# Patient Record
Sex: Female | Born: 1970 | Race: Black or African American | Hispanic: No | Marital: Single | Smoking: Never smoker
Health system: Southern US, Community
[De-identification: ages and names within clinical notes are randomized; demographics above are authoritative.]

## PROBLEM LIST (undated history)

## (undated) DIAGNOSIS — I10 Essential (primary) hypertension: Secondary | ICD-10-CM

## (undated) DIAGNOSIS — D649 Anemia, unspecified: Secondary | ICD-10-CM

## (undated) HISTORY — PX: DILATION AND CURETTAGE OF UTERUS: SHX78

---

## 1998-02-07 ENCOUNTER — Encounter: Payer: Self-pay | Admitting: *Deleted

## 1998-02-07 ENCOUNTER — Inpatient Hospital Stay (HOSPITAL_COMMUNITY): Admission: AD | Admit: 1998-02-07 | Discharge: 1998-02-08 | Payer: Self-pay | Admitting: *Deleted

## 1998-09-20 ENCOUNTER — Inpatient Hospital Stay (HOSPITAL_COMMUNITY): Admission: AD | Admit: 1998-09-20 | Discharge: 1998-09-22 | Payer: Self-pay | Admitting: Obstetrics & Gynecology

## 1998-09-23 ENCOUNTER — Encounter (INDEPENDENT_AMBULATORY_CARE_PROVIDER_SITE_OTHER): Payer: Self-pay

## 1998-09-23 ENCOUNTER — Observation Stay (HOSPITAL_COMMUNITY): Admission: AD | Admit: 1998-09-23 | Discharge: 1998-09-24 | Payer: Self-pay | Admitting: Obstetrics

## 1998-09-24 ENCOUNTER — Encounter: Payer: Self-pay | Admitting: Obstetrics & Gynecology

## 1999-09-09 ENCOUNTER — Encounter (INDEPENDENT_AMBULATORY_CARE_PROVIDER_SITE_OTHER): Payer: Self-pay

## 1999-09-09 ENCOUNTER — Inpatient Hospital Stay (HOSPITAL_COMMUNITY): Admission: AD | Admit: 1999-09-09 | Discharge: 1999-09-10 | Payer: Self-pay | Admitting: *Deleted

## 1999-09-09 ENCOUNTER — Encounter: Payer: Self-pay | Admitting: *Deleted

## 2000-08-20 ENCOUNTER — Emergency Department (HOSPITAL_COMMUNITY): Admission: EM | Admit: 2000-08-20 | Discharge: 2000-08-20 | Payer: Self-pay | Admitting: *Deleted

## 2001-04-01 ENCOUNTER — Emergency Department (HOSPITAL_COMMUNITY): Admission: EM | Admit: 2001-04-01 | Discharge: 2001-04-01 | Payer: Self-pay | Admitting: Emergency Medicine

## 2002-05-25 ENCOUNTER — Inpatient Hospital Stay (HOSPITAL_COMMUNITY): Admission: AD | Admit: 2002-05-25 | Discharge: 2002-05-25 | Payer: Self-pay | Admitting: *Deleted

## 2002-05-27 ENCOUNTER — Emergency Department (HOSPITAL_COMMUNITY): Admission: EM | Admit: 2002-05-27 | Discharge: 2002-05-27 | Payer: Self-pay

## 2003-12-12 ENCOUNTER — Emergency Department (HOSPITAL_COMMUNITY): Admission: EM | Admit: 2003-12-12 | Discharge: 2003-12-12 | Payer: Self-pay | Admitting: Emergency Medicine

## 2004-11-16 ENCOUNTER — Inpatient Hospital Stay (HOSPITAL_COMMUNITY): Admission: AD | Admit: 2004-11-16 | Discharge: 2004-11-16 | Payer: Self-pay | Admitting: *Deleted

## 2005-09-07 ENCOUNTER — Inpatient Hospital Stay (HOSPITAL_COMMUNITY): Admission: AD | Admit: 2005-09-07 | Discharge: 2005-09-07 | Payer: Self-pay | Admitting: Obstetrics and Gynecology

## 2006-09-10 ENCOUNTER — Emergency Department (HOSPITAL_COMMUNITY): Admission: EM | Admit: 2006-09-10 | Discharge: 2006-09-10 | Payer: Self-pay | Admitting: Emergency Medicine

## 2007-04-30 ENCOUNTER — Ambulatory Visit (HOSPITAL_BASED_OUTPATIENT_CLINIC_OR_DEPARTMENT_OTHER): Admission: RE | Admit: 2007-04-30 | Discharge: 2007-04-30 | Payer: Self-pay | Admitting: Specialist

## 2007-04-30 ENCOUNTER — Encounter (INDEPENDENT_AMBULATORY_CARE_PROVIDER_SITE_OTHER): Payer: Self-pay | Admitting: Specialist

## 2007-10-03 ENCOUNTER — Inpatient Hospital Stay (HOSPITAL_COMMUNITY): Admission: AD | Admit: 2007-10-03 | Discharge: 2007-10-03 | Payer: Self-pay | Admitting: Family Medicine

## 2007-10-04 ENCOUNTER — Inpatient Hospital Stay (HOSPITAL_COMMUNITY): Admission: AD | Admit: 2007-10-04 | Discharge: 2007-10-05 | Payer: Self-pay | Admitting: Obstetrics & Gynecology

## 2008-07-04 ENCOUNTER — Emergency Department (HOSPITAL_COMMUNITY): Admission: EM | Admit: 2008-07-04 | Discharge: 2008-07-04 | Payer: Self-pay | Admitting: Emergency Medicine

## 2008-07-22 ENCOUNTER — Ambulatory Visit (HOSPITAL_COMMUNITY): Admission: RE | Admit: 2008-07-22 | Discharge: 2008-07-22 | Payer: Self-pay | Admitting: Chiropractic Medicine

## 2008-12-08 ENCOUNTER — Ambulatory Visit (HOSPITAL_COMMUNITY): Admission: RE | Admit: 2008-12-08 | Discharge: 2008-12-08 | Payer: Self-pay | Admitting: Orthopedic Surgery

## 2008-12-16 ENCOUNTER — Inpatient Hospital Stay (HOSPITAL_COMMUNITY): Admission: AD | Admit: 2008-12-16 | Discharge: 2008-12-16 | Payer: Self-pay | Admitting: Obstetrics and Gynecology

## 2009-04-14 ENCOUNTER — Ambulatory Visit (HOSPITAL_COMMUNITY): Admission: RE | Admit: 2009-04-14 | Discharge: 2009-04-14 | Payer: Self-pay | Admitting: Orthopedic Surgery

## 2010-05-19 LAB — BASIC METABOLIC PANEL
BUN: 6 mg/dL (ref 6–23)
CO2: 27 mEq/L (ref 19–32)
Calcium: 8.7 mg/dL (ref 8.4–10.5)
Creatinine, Ser: 0.62 mg/dL (ref 0.4–1.2)
GFR calc Af Amer: >60 mL/min (ref >60)
Glucose, Bld: 93 mg/dL (ref 70–99)
Potassium: 4 mEq/L (ref 3.5–5.1)
Sodium: 138 mEq/L (ref 135–145)

## 2010-05-19 LAB — PREGNANCY, URINE: Preg Test, Ur: NEGATIVE

## 2010-06-03 LAB — URINALYSIS, ROUTINE W REFLEX MICROSCOPIC
Bilirubin Urine: NEGATIVE
Ketones, ur: NEGATIVE mg/dL
Protein, ur: NEGATIVE mg/dL
Specific Gravity, Urine: >1.03 — ABNORMAL HIGH (ref 1.005–1.030)
Urobilinogen, UA: 0.2 mg/dL (ref 0.0–1.0)
pH: 5.5 (ref 5.0–8.0)

## 2010-06-03 LAB — GC/CHLAMYDIA PROBE AMP, GENITAL
Chlamydia, DNA Probe: NEGATIVE
GC Probe Amp, Genital: NEGATIVE

## 2010-06-03 LAB — URINE MICROSCOPIC-ADD ON

## 2010-06-03 LAB — RPR: RPR Ser Ql: NONREACTIVE
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
## 2010-07-16 NOTE — Discharge Summary (Signed)
[facility]  Patient:    Tabitha Chambers, Tabitha Chambers                        MRN: [id number] Adm. Date:  [id number] Disc. Date: [id number] Attending:  Michaelle Copas Dictator:   Zella Ball, M.D.                           Discharge Summary  ADMISSION DIAGNOSIS:          Missed abortion.  DISCHARGE DIAGNOSES:          1. Missed abortion.                               2. Insufficient prenatal care.                               3. Repeated fetal demise.  PROCEDURES:                   None.  HISTORY OF PRESENT ILLNESS:   This is a 40 year old African-American female, G6, P2-3-0-2, who presented to [facility] at 22-5/7 weeks by last menstrual period.  At presentation, she stated that she had had a spontaneous rupture of membranes greater than 12-18 hours prior to presentation to [facility].  However, because of child-care issues, she could not get to [facility] when her membranes spontaneously ruptured.  The patient stated that she had had no prenatal care with this pregnancy.  On presentation to the hospital, the patient was found to have no fetal heart tones detectable by ultrasound and was found to have a fully dilated cervix with fetus presenting in footling breech position.  HOSPITAL COURSE:              The patient was admitted to the hospital and had her labor induced with Pitocin and was delivered of a nonviable 65-week-old fetus at [redacted] weeks gestation.  The patient remained in the hospital for approximately 12 hours postdelivery and refused to speak to social work about possible outpatient follow-up as well as the issue of why she had not received prenatal prenatal care.  The patient then left the hospital against medical advice prior to being discharged.  PAST MEDICAL HISTORY:         The patient has a history of three previous fetal losses between 54 and 34 weeks, apparently presenting with the same presentation each time she has  presented to [facility], stating an inability to get child care for her older children and, upon presentation to the hospital, the fetal demise has already taken place.  These previous losses are documented in her chart in 2000, 1999, and 1998.  CONDITION ON DISCHARGE:       The patient left the hospital against medical advice.  However, the patient was given a prescription for high-strength ibuprofen for possible cramping and encouraged to return to the hospital should she have any postpartum hemorrhage or fevers. DD:  10/03/99 TD:  10/04/99 Job: [id number]

## 2010-11-26 LAB — URINE CULTURE

## 2010-11-26 LAB — URINALYSIS, ROUTINE W REFLEX MICROSCOPIC
Glucose, UA: NEGATIVE
Glucose, UA: NEGATIVE
Nitrite: NEGATIVE
Nitrite: NEGATIVE
Specific Gravity, Urine: 1.015
Urobilinogen, UA: 0.2

## 2010-11-26 LAB — WET PREP, GENITAL
Trich, Wet Prep: NONE SEEN
Yeast Wet Prep HPF POC: NONE SEEN

## 2010-11-26 LAB — URINE MICROSCOPIC-ADD ON

## 2010-11-26 LAB — POCT PREGNANCY, URINE: Preg Test, Ur: NEGATIVE

## 2010-12-14 LAB — POCT URINALYSIS DIP (DEVICE)
Bilirubin Urine: NEGATIVE
Glucose, UA: NEGATIVE
Ketones, ur: NEGATIVE
Urobilinogen, UA: 0.2
pH: 6

## 2010-12-14 LAB — URINE CULTURE: Culture: NO GROWTH

## 2010-12-14 LAB — POCT PREGNANCY, URINE
Operator id: 235561
Preg Test, Ur: NEGATIVE

## 2011-02-18 ENCOUNTER — Encounter: Payer: Self-pay | Admitting: *Deleted

## 2011-02-18 ENCOUNTER — Emergency Department (HOSPITAL_COMMUNITY)
Admission: EM | Admit: 2011-02-18 | Discharge: 2011-02-18 | Disposition: A | Discharge status: Home / Self Care | Payer: No Typology Code available for payment source | Attending: Emergency Medicine | Admitting: Emergency Medicine

## 2011-02-18 DIAGNOSIS — R059 Cough, unspecified: Secondary | ICD-10-CM | POA: Insufficient documentation

## 2011-02-18 DIAGNOSIS — IMO0001 Reserved for inherently not codable concepts without codable children: Secondary | ICD-10-CM | POA: Insufficient documentation

## 2011-02-18 DIAGNOSIS — I10 Essential (primary) hypertension: Secondary | ICD-10-CM | POA: Insufficient documentation

## 2011-02-18 DIAGNOSIS — J3489 Other specified disorders of nose and nasal sinuses: Secondary | ICD-10-CM | POA: Insufficient documentation

## 2011-02-18 DIAGNOSIS — R05 Cough: Secondary | ICD-10-CM | POA: Insufficient documentation

## 2011-02-18 DIAGNOSIS — R5383 Other fatigue: Secondary | ICD-10-CM | POA: Insufficient documentation

## 2011-02-18 DIAGNOSIS — R5381 Other malaise: Secondary | ICD-10-CM | POA: Insufficient documentation

## 2011-02-18 DIAGNOSIS — J111 Influenza due to unidentified influenza virus with other respiratory manifestations: Secondary | ICD-10-CM | POA: Insufficient documentation

## 2011-02-18 HISTORY — DX: Essential (primary) hypertension: I10

## 2011-02-18 MED ORDER — ACETAMINOPHEN 500 MG PO TABS
1000.0000 mg | ORAL_TABLET | ORAL | Status: DC
  Filled 2011-02-18 (×2): qty 2

## 2011-02-18 MED ORDER — ACETAMINOPHEN 325 MG PO TABS
ORAL_TABLET | ORAL | Status: AC
  Filled 2011-02-18: qty 3

## 2011-02-18 NOTE — ED Provider Notes (Signed)
History     CSN: [id number]  Arrival date & time 02/18/11  1457   First MD Initiated Contact with Patient 02/18/11 1701      Chief Complaint  Patient presents with  . Generalized Body Aches    (Consider location/radiation/quality/duration/timing/severity/associated sxs/prior treatment) The history is provided by the patient.  Tabitha Chambers is a 40 y.o. female presents with c/o myalgias leading to desire to be assessed in the ED. The sx(s) have been present for 1 day. Additional concerns are rhinorrhea, cough, weakness. Causative factors are none known. She had a similar episode one month ago. She has not had influenza vaccine this year. Palliative factors are none. The distress associated is mild. The disorder has been present for 6 weeks.  Past Medical History  Diagnosis Date  . Hypertension     History reviewed. No pertinent past surgical history.  No family history on file.  History  Substance Use Topics  . Smoking status: Not on file  . Smokeless tobacco: Not on file  . Alcohol Use:     OB History    Grav Para Term Preterm Abortions TAB SAB Ect Mult Living                  Review of Systems  All other systems reviewed and are negative.    Allergies  Review of patient's allergies indicates no known allergies.  Home Medications   Current Outpatient Rx  Name Route Sig Dispense Refill  . DIPHENHYDRAMINE HCL 25 MG PO CAPS Oral Take 50 mg by mouth every 6 (six) hours as needed. For allergies        BP 137/83  Pulse 118  Temp(Src) 99.4 F (37.4 C) (Oral)  Resp 24  SpO2 100%  Physical Exam  Nursing note and vitals reviewed. Constitutional: She is oriented to person, place, and time. She appears well-developed and well-nourished.       Skin is warm to touch  HENT:  Head: Normocephalic.  Eyes: Conjunctivae and EOM are normal. Pupils are equal, round, and reactive to light.  Neck: Normal range of motion. Neck supple.  Cardiovascular: Normal rate and  regular rhythm.   Pulmonary/Chest: Effort normal and breath sounds normal.  Abdominal: Soft.  Musculoskeletal: Normal range of motion.  Neurological: She is alert and oriented to person, place, and time.  Skin: Skin is warm.  Psychiatric: She has a normal mood and affect. Her behavior is normal. Judgment and thought content normal.    ED Course  Procedures (including critical care time) 17:54- repeat vitals: Temperature, increased and blood pressure decreased. Labs Reviewed - No data to display No results found.   1. Influenza       MDM  Patient has signs, and symptoms of acute influenza syndrome. I doubt community-acquired pneumonia, metabolic instability or serious bacterial illness. She is not in a high-risk group for  ILI.       Flint Melter, MD 02/18/11 [phone]

## 2011-02-18 NOTE — ED Notes (Signed)
Pt resp e/n, NAD. No verbal complaints at this time.  Denies n/v at this time

## 2011-02-18 NOTE — ED Notes (Signed)
To ed for eval of body aches for the past 2 days.

## 2011-07-31 ENCOUNTER — Emergency Department (HOSPITAL_COMMUNITY)
Admission: EM | Admit: 2011-07-31 | Discharge: 2011-07-31 | Disposition: A | Discharge status: Home / Self Care | Payer: No Typology Code available for payment source | Attending: Emergency Medicine | Admitting: Emergency Medicine

## 2011-07-31 ENCOUNTER — Emergency Department (HOSPITAL_COMMUNITY): Payer: No Typology Code available for payment source

## 2011-07-31 ENCOUNTER — Encounter (HOSPITAL_COMMUNITY): Payer: Self-pay | Admitting: Emergency Medicine

## 2011-07-31 DIAGNOSIS — S92919A Unspecified fracture of unspecified toe(s), initial encounter for closed fracture: Secondary | ICD-10-CM | POA: Insufficient documentation

## 2011-07-31 DIAGNOSIS — X58XXXA Exposure to other specified factors, initial encounter: Secondary | ICD-10-CM | POA: Insufficient documentation

## 2011-07-31 MED ORDER — ONDANSETRON HCL 4 MG PO TABS: 4.0000 mg | ORAL_TABLET | Freq: Four times a day (QID) | ORAL | Status: AC

## 2011-07-31 MED ORDER — KETOROLAC TROMETHAMINE 60 MG/2ML IM SOLN
60.0000 mg | Freq: Once | INTRAMUSCULAR | Status: AC
  Administered 2011-07-31: 60 mg via INTRAMUSCULAR
  Filled 2011-07-31: qty 2

## 2011-07-31 MED ORDER — HYDROCODONE-ACETAMINOPHEN 5-500 MG PO TABS: 1.0000 | ORAL_TABLET | Freq: Four times a day (QID) | ORAL | Status: AC | PRN

## 2011-07-31 NOTE — ED Notes (Signed)
Pt presenting to ed with c/o falling up the stairs last night and having left pinky toe pain pt with discoloration noted to pinky toe.

## 2011-07-31 NOTE — Discharge Instructions (Signed)
Please schedule a follow-up appointment with the orthopedist to ensure that you have no complications with the healing process. Due to their being two fractures, I think this is necessary.   Toe Fracture Your caregiver has diagnosed you as having a fractured toe. A toe fracture is a break in the bone of a toe. "Buddy taping" is a way of splinting your broken toe, by taping the broken toe to the toe next to it. This "buddy taping" will keep the injured toe from moving beyond normal range of motion. Buddy taping also helps the toe heal in a more normal alignment. It may take 6 to 8 weeks for the toe injury to heal. HOME CARE INSTRUCTIONS   Leave your toes taped together for as long as directed by your caregiver or until you see a doctor for a follow-up examination. You can change the tape after bathing. Always use a small piece of gauze or cotton between the toes when taping them together. This will help the skin stay dry and prevent infection.   Apply ice to the injury for 15 to 20 minutes each hour while awake for the first 2 days. Put the ice in a plastic bag and place a towel between the bag of ice and your skin.   After the first 2 days, apply heat to the injured area. Use heat for the next 2 to 3 days. Place a heating pad on the foot or soak the foot in warm water as directed by your caregiver.   Keep your foot elevated as much as possible to lessen swelling.   Wear sturdy, supportive shoes. The shoes should not pinch the toes or fit tightly against the toes.   Your caregiver may prescribe a rigid shoe if your foot is very swollen.   Your may be given crutches if the pain is too great and it hurts too much to walk.   Only take over-the-counter or prescription medicines for pain, discomfort, or fever as directed by your caregiver.   If your caregiver has given you a follow-up appointment, it is very important to keep that appointment. Not keeping the appointment could result in a chronic or  permanent injury, pain, and disability. If there is any problem keeping the appointment, you must call back to this facility for assistance.  SEEK MEDICAL CARE IF:   You have increased pain or swelling, not relieved with medications.   The pain does not get better after 1 week.   Your injured toe is cold when the others are warm.  SEEK IMMEDIATE MEDICAL CARE IF:   The toe becomes cold, numb, or white.   The toe becomes hot (inflamed) and red.  Document Released: 02/12/2000 Document Revised: 02/03/2011 Document Reviewed: 10/01/2007 [facility] Patient Information 2012 [place], [place].Fracture A fracture is a break in a bone, due to a force on the bone that is greater than the bone's strength can handle. There are many types of fractures, including:  Complete fracture: The break passes completely through the bone.   Displaced: The ends of the bone fragments are not properly aligned.   Non-displaced: The ends of the bone fragments are in proper alignment.   Incomplete fracture (greenstick): The break does not pass completely through the bone. Incomplete fractures may or may not be angular (angulated).   Open fracture (compound): Part of the broken bone pokes through the skin. Open fractures have a high risk for infection.   Closed fracture: The fracture has not broken through the skin.  Comminuted fracture: The bone is broken into more than two pieces.   Compression fracture: The break occurs from extreme pressure on the bone (includes crushing injury).   Impacted fracture: The broken bone ends have been driven into each other.   Avulsion fracture: A ligament or tendon pulls a small piece of bone off from the main bony segment.   Pathologic fracture: A fracture due to the bone being made weak by a disease (ie. osteoporosis or tumors).   Stress fracture: A fracture caused by intense exercise or repetitive and prolonged pressure that makes the bone weak.  SYMPTOMS   Pain,  tenderness, bleeding, bruising, and swelling at the fracture site.   Weakness and inability to bear weight on the injured extremity.   Paleness and deformity (sometimes).   Loss of pulse, numbness, tingling, or paralysis below the fracture site (usually a limb); these are emergencies.  CAUSES  Bone being subjected to a force greater than its strength. RISK INCREASES WITH:  Contact sports and falls from heights.   Previous or current bone problems (i.e. osteoporosis or tumors).   Poor balance.   Poor strength and flexibility.  PREVENTION   Warm up and stretch properly before activity.   Maintain physical fitness:   Cardiovascular fitness.   Muscle strength.   Flexibility and endurance.   Wear proper protective equipment.   Use proper exercise technique.  RELATED COMPLICATIONS   Bone fails to heal (nonunion).   Bone heals in a poor position (malunion).   Low blood volume (hypovolemic), shock due to blood loss.   Clump of fat cells travels through the blood (fatembolus) from the injury site to the lungs or brain (more common with thigh fractures).   Obstruction of nearby arteries.  TREATMENT  Treatment first requires realigning of the bones (reduction) by a medically trained person, if the fracture is displaced. After realignment if the fracture is completed, or for non-displaced fractures, ice and medicine are used to reduce pain and inflammation. The bone and adjacent joints are then restrained with a splint, cast, or brace to allow the bones to heal without moving. Surgery is sometimes needed, to reposition the bones and hold the position with rods, pins, plates, or screws. Restraint for long periods of time may result in muscle and joint weakness or build up of fluid in tissues (edema). For this reason, physical therapy is often needed to regain strength and full range of motion. Recovery is complete when there is no bone motion at the fracture site and x-rays  (radiographs) show complete healing.  MEDICATION   General anesthesia, sedation, or muscle relaxants may be needed to allow for realignment of the fracture. If pain medicine is needed, nonsteroidal anti-inflammatory medicines (aspirin and ibuprofen), or other minor pain relievers (acetaminophen), are often advised.   Do not take pain medicine for 7 days before surgery.   Stronger pain relievers may be prescribed by your caregiver. Use only as directed and only as much as you need.  SEEK MEDICAL CARE IF:   The following occur after restraint or surgery. (Report any of these signs immediately):   Swelling above or below the fracture site.   Severe, persistent pain.   Blue or gray skin below the fracture site, especially under the nails. Numbness or loss of feeling below the fracture site.  Document Released: 02/14/2005 Document Revised: 02/03/2011 Document Reviewed: 05/29/2008 [facility] Patient Information 2012 [place], [place].Toe Fracture  with Rehab A fracture is a break in the bone that can be  either partial or complete. Fractures of the toe bones may or may not include the joints that separate the bones. SYMPTOMS   Severe pain over the fracture site at the time of injury that may persist for an extend period of time.   Pain, tenderness, inflammation, and/or bruising (contusion) over the fracture site.   Visible deformity, if the bone fragments are not properly aligned (displaced fracture).   Signs of vascular damage: numbness or coldness (uncommon).  CAUSES  Toe fractures occur when a force is placed on the bone that is greater than it can withstand.  Direct hit (trauma) to the toe.   Indirect trauma to the toe, such as forcefully pivoting on a planted foot.  RISK INCREASES WITH:  Performing activities barefoot (i.e. ballet, gymnastics).   Wearing shoes with little support or protection.   Sports with cleats (i.e. football, rugby, lacrosse, soccer).   Bone disease (i.e.  osteoporosis, bone tumors).  PREVENTION   Wear properly fitted and protective shoes.   Protect previously injured toes with tape or padding.  PROGNOSIS  If treated properly, toe fractures usually heal within 4 to 6 weeks. RELATED COMPLICATIONS   Failure of the fracture to heal (nonunion).   Healing of the fracture in a poor position (malunion).   Recurring symptoms.   Recurring symptoms that result in a chronic problem.   Excessive bleeding, causing pressure on nerves and blood vessels (rare).   Arthritis of the affected joints.   Stopping of bone growth in children.   Infection in fractures where the skin is broken over the fracture (open fracture).   Shortening of injured bones.  TREATMENT  Treatment first involves the use of ice and medicine to reduce pain and inflammation. The toe should be restrained for a period of time to allow for healing, usually about 4 weeks. Your caregiver may advise wearing a hard-soled shoe to minimize stress on the healing bone. Surgery is uncommon for this injury, but may be necessary if the fracture is severely displaced or if the bone pushes through the skin. Surgery typically involves the use of screws, pins, and/or plates to hold the fracture in place. After surgery, restraint of the foot is necessary. MEDICATION   If pain medicine is necessary, nonsteroidal anti-inflammatory medications (aspirin and ibuprofen), or other minor pain relievers (acetaminophen), are often recommended.   Do not take pain medicine for 7 days before surgery.   Prescription pain relievers may be given if your caregiver thinks they are needed. Use only as directed and only as much as you need.  COLD THERAPY  Cold treatment (icing) relieves pain and reduces inflammation. Cold treatment should be applied for 10 to 15 minutes every 2 to 3 hours, and immediately after activity that aggravates your symptoms. Use ice packs or an ice massage. SEEK MEDICAL CARE IF:    Treatment does not seem to help, or the condition gets worse.   Any medicines produce negative side effects.   Any complications from surgery occur:   Pain, numbness, or coldness in the affected foot.   Discoloration beneath the toenails (blue or gray) of the affected foot.   Signs of infection (fever, pain, inflammation, redness, or persistent bleeding).  EXERCISES RANGE OF MOTION (ROM) AND STRETCHING EXERCISES - Toe Fracture (Phalangeal) These exercises may help you when beginning to rehabilitate your injury. Your symptoms may resolve with or without further involvement from your physician, physical therapist or athletic trainer. While completing these exercises, remember:   Restoring tissue flexibility  helps normal motion to return to the joints. This allows healthier, less painful movement and activity.   An effective stretch should be held for at least 30 seconds.   A stretch should never be painful. You should only feel a gentle lengthening or release in the stretched tissue.  RANGE OF MOTION - Dorsi/Plantar Flexion  While sitting with your right / left knee straight, draw the top of your foot upwards by flexing your ankle. Then reverse the motion, pointing your toes downward.   Hold each position for __________ seconds.   After completing your first set of exercises, repeat this exercise with your knee bent.  Repeat __________ times. Complete this exercise __________ times per day.  RANGE OF MOTION - Ankle Alphabet Imagine your right / left big toe is a pen. Keeping your hip and knee still, write out the entire alphabet with your "pen." Make the letters as large as you can without increasing any discomfort. Repeat __________ times. Complete this exercise __________ times per day.  RANGE OF MOTION - Toe Extension, Flexion  Sit with your right / left leg crossed over your opposite knee.   Grasp your toes and gently pull them back toward the top of your foot. You should  feel a stretch on the bottom of your toes and foot.   Hold this stretch for __________ seconds.   Now, gently pull your toes toward the bottom of your foot. You should feel a stretch on the top of your toes and foot.   Hold this stretch for __________ seconds.  Repeat __________ times. Complete this stretch__________ times per day.  STRENGTHENING EXERCISES - Toe Fracture (Phalangeal) These exercises may help you when beginning to rehabilitate your injury. They may resolve your symptoms with or without further involvement from your physician, physical therapist or athletic trainer. While completing these exercises, remember:   Muscles can gain both the endurance and the strength needed for everyday activities through controlled exercises.   Complete these exercises as instructed by your physician, physical therapist or athletic trainer. Increase the resistance and repetitions only as guided.   You may experience muscle soreness or fatigue, but the pain or discomfort you are trying to eliminate should never worsen during these exercises. If this pain does get worse, stop and make sure you are following the directions exactly. If the pain is still present after adjustments, discontinue the exercise until you can discuss the trouble with your clinician.  STRENGTH - Towel Curls  Sit in a chair, on a non-carpeted surface.   Place your foot on a towel, keeping your heel on the floor.   Pull the towel toward your heel only by curling your toes. Keep your heel on the floor.   If instructed by your physician, physical therapist or athletic trainer, add ____________________ at the end of the towel.  Repeat __________ times. Complete this exercise __________ times per day. Document Released: 02/14/2005 Document Revised: 02/03/2011 Document Reviewed: 05/29/2008 [facility] Patient Information 2012 [place], [place].
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
## 2011-08-04 NOTE — ED Provider Notes (Signed)
Medical screening examination/treatment/procedure(s) were performed by non-physician practitioner and as supervising physician I was immediately available for consultation/collaboration.  Kaelani Kendrick, MD 08/04/11 0241 

## 2011-08-21 ENCOUNTER — Emergency Department (HOSPITAL_COMMUNITY)
Admission: EM | Admit: 2011-08-21 | Discharge: 2011-08-21 | Disposition: A | Discharge status: Home / Self Care | Payer: No Typology Code available for payment source | Attending: Emergency Medicine | Admitting: Emergency Medicine

## 2011-08-21 ENCOUNTER — Emergency Department (HOSPITAL_COMMUNITY): Payer: No Typology Code available for payment source

## 2011-08-21 ENCOUNTER — Encounter (HOSPITAL_COMMUNITY): Payer: Self-pay | Admitting: Emergency Medicine

## 2011-08-21 DIAGNOSIS — I10 Essential (primary) hypertension: Secondary | ICD-10-CM | POA: Insufficient documentation

## 2011-08-21 DIAGNOSIS — S01501A Unspecified open wound of lip, initial encounter: Secondary | ICD-10-CM | POA: Insufficient documentation

## 2011-08-21 DIAGNOSIS — S0181XA Laceration without foreign body of other part of head, initial encounter: Secondary | ICD-10-CM

## 2011-08-21 DIAGNOSIS — Z23 Encounter for immunization: Secondary | ICD-10-CM | POA: Insufficient documentation

## 2011-08-21 MED ORDER — HYDROCODONE-ACETAMINOPHEN 5-325 MG PO TABS
1.0000 | ORAL_TABLET | Freq: Once | ORAL | Status: AC
  Administered 2011-08-21: 1 via ORAL
  Filled 2011-08-21: qty 1

## 2011-08-21 MED ORDER — AMOXICILLIN 500 MG PO CAPS: 500.0000 mg | ORAL_CAPSULE | Freq: Three times a day (TID) | ORAL | Status: AC

## 2011-08-21 MED ORDER — HYDROCODONE-ACETAMINOPHEN 5-325 MG PO TABS: 1.0000 | ORAL_TABLET | Freq: Four times a day (QID) | ORAL | Status: AC | PRN

## 2011-08-21 MED ORDER — LIDOCAINE HCL 1 % IJ SOLN
INTRAMUSCULAR | Status: AC
  Administered 2011-08-21: 20 mL via SUBCUTANEOUS
  Filled 2011-08-21: qty 20

## 2011-08-21 MED ORDER — TETANUS-DIPHTH-ACELL PERTUSSIS 5-2.5-18.5 LF-MCG/0.5 IM SUSP
0.5000 mL | Freq: Once | INTRAMUSCULAR | Status: AC
  Administered 2011-08-21: 0.5 mL via INTRAMUSCULAR
  Filled 2011-08-21 (×2): qty 0.5

## 2011-08-21 NOTE — Discharge Instructions (Signed)
Facial Laceration  A facial laceration is a cut on the face. Lacerations usually heal quickly, but they need special care to reduce scarring. It will take 1 to 2 years for the scar to lose its redness and to heal completely.  TREATMENT   Some facial lacerations may not require closure. Some lacerations may not be able to be closed due to an increased risk of infection. It is important to see your caregiver as soon as possible after an injury to minimize the risk of infection and to maximize the opportunity for successful closure.  If closure is appropriate, pain medicines may be given, if needed. The wound will be cleaned to help prevent infection. Your caregiver will use stitches (sutures), staples, wound glue (adhesive), or skin adhesive strips to repair the laceration. These tools bring the skin edges together to allow for faster healing and a better cosmetic outcome. However, all wounds will heal with a scar.   Once the wound has healed, scarring can be minimized by covering the wound with sunscreen during the day for 1 full year. Use a sunscreen with an SPF of at least 30. Sunscreen helps to reduce the pigment that will form in the scar. When applying sunscreen to a completely healed wound, massage the scar for a few minutes to help reduce the appearance of the scar. Use circular motions with your fingertips, on and around the scar. Do not massage a healing wound.  HOME CARE INSTRUCTIONS  For sutures:   Keep the wound clean and dry.   If you were given a bandage (dressing), you should change it at least once a day. Also change the dressing if it becomes wet or dirty, or as directed by your caregiver.   Wash the wound with soap and water 2 times a day. Rinse the wound off with water to remove all soap. Pat the wound dry with a clean towel.   After cleaning, apply a thin layer of the antibiotic ointment recommended by your caregiver. This will help prevent infection and keep the dressing from sticking.   You  may shower as usual after the first 24 hours. Do not soak the wound in water until the sutures are removed.   Only take over-the-counter or prescription medicines for pain, discomfort, or fever as directed by your caregiver.   Get your sutures removed as directed by your caregiver. With facial lacerations, sutures should usually be taken out after 4 to 5 days to avoid stitch marks.   Wait a few days after your sutures are removed before applying makeup.  For skin adhesive strips:   Keep the wound clean and dry.   Do not get the skin adhesive strips wet. You may bathe carefully, using caution to keep the wound dry.   If the wound gets wet, pat it dry with a clean towel.   Skin adhesive strips will fall off on their own. You may trim the strips as the wound heals. Do not remove skin adhesive strips that are still stuck to the wound. They will fall off in time.  For wound adhesive:   You may briefly wet your wound in the shower or bath. Do not soak or scrub the wound. Do not swim. Avoid periods of heavy perspiration until the skin adhesive has fallen off on its own. After showering or bathing, gently pat the wound dry with a clean towel.   Do not apply liquid medicine, cream medicine, ointment medicine, or makeup to your wound while the   before your wound is healed.   If a dressing is placed over the wound, be careful not to apply tape directly over the skin adhesive. This may cause the adhesive to be pulled off before the wound is healed.   Avoid prolonged exposure to sunlight or tanning lamps while the skin adhesive is in place. Exposure to ultraviolet light in the first year will darken the scar.   The skin adhesive will usually remain in place for 5 to 10 days, then naturally fall off the skin. Do not pick at the adhesive film.  You may need a tetanus shot if:  You cannot remember when you had your last tetanus shot.   You have  never had a tetanus shot.  If you get a tetanus shot, your arm may swell, get red, and feel warm to the touch. This is common and not a problem. If you need a tetanus shot and you choose not to have one, there is a rare chance of getting tetanus. Sickness from tetanus can be serious. SEEK IMMEDIATE MEDICAL CARE IF:  You develop redness, pain, or swelling around the wound.   There is yellowish-white fluid (pus) coming from the wound.   You develop chills or a fever.  MAKE SURE YOU:  Understand these instructions.   Will watch your condition.   Will get help right away if you are not doing well or get worse.  Document Released: 03/24/2004 Document Revised: 02/03/2011 Document Reviewed: 08/09/2010 [facility] Patient Information 2012 [place], [place].  Have the sutures removed in 5 days maximum
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
## 2011-08-21 NOTE — ED Provider Notes (Signed)
Medical screening examination/treatment/procedure(s) were performed by non-physician practitioner and as supervising physician I was immediately available for consultation/collaboration.   Petina Muraski, MD 08/21/11 0501 

## 2011-08-21 NOTE — ED Notes (Signed)
Patient given discharge instructions, information, prescriptions, and diet order. Patient states that they adequately understand discharge information given and to return to ED if symptoms return or worsen.     
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
## 2011-08-24 ENCOUNTER — Emergency Department (HOSPITAL_COMMUNITY)
Admission: EM | Admit: 2011-08-24 | Discharge: 2011-08-24 | Disposition: A | Discharge status: Home / Self Care | Payer: No Typology Code available for payment source | Attending: Emergency Medicine | Admitting: Emergency Medicine

## 2011-08-24 ENCOUNTER — Encounter (HOSPITAL_COMMUNITY): Payer: Self-pay | Admitting: Emergency Medicine

## 2011-08-24 DIAGNOSIS — IMO0002 Reserved for concepts with insufficient information to code with codable children: Secondary | ICD-10-CM | POA: Insufficient documentation

## 2011-08-24 DIAGNOSIS — I10 Essential (primary) hypertension: Secondary | ICD-10-CM | POA: Insufficient documentation

## 2011-08-24 DIAGNOSIS — L089 Local infection of the skin and subcutaneous tissue, unspecified: Secondary | ICD-10-CM | POA: Insufficient documentation

## 2011-08-24 MED ORDER — CLINDAMYCIN HCL 300 MG PO CAPS
300.0000 mg | ORAL_CAPSULE | Freq: Once | ORAL | Status: AC
  Administered 2011-08-24: 300 mg via ORAL
  Filled 2011-08-24: qty 1

## 2011-08-24 MED ORDER — CLINDAMYCIN HCL 150 MG PO CAPS: 150.0000 mg | ORAL_CAPSULE | Freq: Four times a day (QID) | ORAL | Status: AC

## 2011-08-24 NOTE — Discharge Instructions (Signed)
Wound Infection A wound infection happens when a type of germ (bacteria) grows in a wound. Caring for the infection can help the wound heal. Wound infections need treatment. HOME CARE   Only take medicine as told by your doctor.   Take your antibiotic medicine as told. Finish it even if you start to feel better.   Clean the wound with mild soap and water as told. Rinse the soap off. Pat the area dry with a clean towel. Do not rub the wound.   Change any bandages (dressings) as told by your doctor.   Put cream and a bandage on the wound as told by your doctor.   If the bandage sticks, wet it with soapy water to remove the bandage.   Change the bandage if it gets wet, dirty, or starts to smell.   Take showers. Do not take baths, swim, or do anything that puts your wound under water.   Avoid exercise that makes you sweat.   If your wound itches, use a medicine that helps stop itching. Do not pick or scratch at the wound.   Keep all doctor visits as told.  GET HELP RIGHT AWAY IF:   You have more puffiness (swelling), pain, or redness around the wound.   You have more yellowish-white fluid (pus) coming from the wound.   You have a bad smell coming from the wound.   Your wound breaks open more.   You have a fever.  MAKE SURE YOU:   Understand these instructions.   Will watch your condition.   Will get help right away if you are not doing well or get worse.  Document Released: 11/24/2007 Document Revised: 02/03/2011 Document Reviewed: 07/26/2010 [facility] Patient Information 2012 [place], [place]. Please rinse mouth with copious amounts of water after eating or drinking any foods or beverages until the laceration is sutured.  Area is totally healed Please take the antibiotic as directed until, completed.  Please return to the emergency department in 2 days for recheck.  Wash the area with soap and water daily basis
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
## 2011-08-24 NOTE — ED Notes (Signed)
Patient states that she had sutures earlier this week to her bottom lip. The patient reports that they now have drainage and possible fever

## 2011-08-24 NOTE — ED Provider Notes (Signed)
Medical screening examination/treatment/procedure(s) were performed by non-physician practitioner and as supervising physician I was immediately available for consultation/collaboration.  Olivia Mackie, MD 08/24/11 [phone]

## 2011-08-26 ENCOUNTER — Emergency Department (HOSPITAL_COMMUNITY)
Admission: EM | Admit: 2011-08-26 | Discharge: 2011-08-26 | Discharge status: Against Medical Advice | Payer: No Typology Code available for payment source | Attending: Emergency Medicine | Admitting: Emergency Medicine

## 2011-08-26 DIAGNOSIS — Z0389 Encounter for observation for other suspected diseases and conditions ruled out: Secondary | ICD-10-CM | POA: Insufficient documentation

## 2011-08-27 ENCOUNTER — Emergency Department (HOSPITAL_COMMUNITY)
Admission: EM | Admit: 2011-08-27 | Discharge: 2011-08-27 | Disposition: A | Discharge status: Home / Self Care | Payer: No Typology Code available for payment source | Attending: Emergency Medicine | Admitting: Emergency Medicine

## 2011-08-27 ENCOUNTER — Encounter (HOSPITAL_COMMUNITY): Payer: Self-pay | Admitting: *Deleted

## 2011-08-27 DIAGNOSIS — L089 Local infection of the skin and subcutaneous tissue, unspecified: Secondary | ICD-10-CM

## 2011-08-27 DIAGNOSIS — Y849 Medical procedure, unspecified as the cause of abnormal reaction of the patient, or of later complication, without mention of misadventure at the time of the procedure: Secondary | ICD-10-CM | POA: Insufficient documentation

## 2011-08-27 DIAGNOSIS — Z4802 Encounter for removal of sutures: Secondary | ICD-10-CM

## 2011-08-27 DIAGNOSIS — T8140XA Infection following a procedure, unspecified, initial encounter: Secondary | ICD-10-CM | POA: Insufficient documentation

## 2011-08-27 DIAGNOSIS — Z79899 Other long term (current) drug therapy: Secondary | ICD-10-CM | POA: Insufficient documentation

## 2011-08-27 DIAGNOSIS — I10 Essential (primary) hypertension: Secondary | ICD-10-CM | POA: Insufficient documentation
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
## 2011-08-27 NOTE — ED Provider Notes (Signed)
History     CSN: [id number]  Arrival date & time 08/27/11  0501   First MD Initiated Contact with Patient 08/27/11 [phone]      Chief Complaint  Patient presents with  . Suture / Staple Removal    (Consider location/radiation/quality/duration/timing/severity/associated sxs/prior treatment) HPI History provided by patient. Sustained lower lip laceration on 08/21/2011. Evaluated here and had sutures placed. Patient returned on 08/24/2011 with wound infection and was placed on antibiotics. Patient returns today requesting suture removal. She denies any problems or complications at this time. She does have some persistent swelling in wound drainage but denies any pain, fevers or vomiting. Mild/moderate severity. Past Medical History  Diagnosis Date  . Hypertension     History reviewed. No pertinent past surgical history.  History reviewed. No pertinent family history.  History  Substance Use Topics  . Smoking status: Never Smoker   . Smokeless tobacco: Not on file  . Alcohol Use: Yes     socially    OB History    Grav Para Term Preterm Abortions TAB SAB Ect Mult Living                  Review of Systems  Constitutional: Negative for fever and chills.  HENT: Negative for neck pain and neck stiffness.   Eyes: Negative for pain.  Respiratory: Negative for shortness of breath.   Cardiovascular: Negative for chest pain.  Gastrointestinal: Negative for abdominal pain.  Genitourinary: Negative for dysuria.  Musculoskeletal: Negative for back pain.  Skin: Positive for wound. Negative for rash.  Neurological: Negative for headaches.  All other systems reviewed and are negative.    Allergies  Review of patient's allergies indicates no known allergies.  Home Medications   Current Outpatient Rx  Name Route Sig Dispense Refill  . AMOXICILLIN 500 MG PO CAPS Oral Take 1 capsule (500 mg total) by mouth 3 (three) times daily. 21 capsule 0  . CLINDAMYCIN HCL 150 MG PO CAPS Oral  Take 1 capsule (150 mg total) by mouth every 6 (six) hours. 28 capsule 0  . HYDROCHLOROTHIAZIDE 12.5 MG PO CAPS Oral Take 12.5 mg by mouth daily.    Marland Kitchen HYDROCODONE-ACETAMINOPHEN 5-325 MG PO TABS Oral Take 1-2 tablets by mouth every 6 (six) hours as needed for pain. 12 tablet 0    BP 141/96  Pulse 64  Temp 97.8 F (36.6 C) (Oral)  Resp 16  SpO2 100%  LMP 07/30/2011  Physical Exam  Constitutional: She is oriented to person, place, and time. She appears well-developed and well-nourished.  HENT:  Head: Normocephalic.       Poorly healed laceration just below right lower lip, there is surrounding swelling and crusting with open wound. Mild surrounding edema. Some purulent discharge. No active drainage. No surrounding erythema or increased warmth to touch. No intraoral wound. No dental tenderness.  Eyes: Conjunctivae and EOM are normal. Pupils are equal, round, and reactive to light.  Neck: Trachea normal. Neck supple. No thyromegaly present.  Cardiovascular: Normal rate, regular rhythm, S1 normal, S2 normal and normal pulses.     No systolic murmur is present   No diastolic murmur is present  Pulses:      Radial pulses are 2+ on the right side, and 2+ on the left side.  Pulmonary/Chest: Effort normal and breath sounds normal. She has no wheezes. She has no rhonchi. She has no rales. She exhibits no tenderness.  Abdominal: Soft. Normal appearance and bowel sounds are normal. There is no tenderness.  There is no CVA tenderness and negative Murphy's sign.  Musculoskeletal:       BLE:s Calves nontender, no cords or erythema, negative Homans sign  Neurological: She is alert and oriented to person, place, and time. She has normal strength. No cranial nerve deficit or sensory deficit. GCS eye subscore is 4. GCS verbal subscore is 5. GCS motor subscore is 6.  Skin: Skin is warm and dry. No rash noted. She is not diaphoretic.  Psychiatric: Her speech is normal.       Cooperative and appropriate     ED Course  SUTURE REMOVAL Date/Time: 08/27/2011 7:31 AM Performed by: Sunnie Nielsen Authorized by: Sunnie Nielsen Consent: Verbal consent obtained. Risks and benefits: risks, benefits and alternatives were discussed Consent given by: patient Patient understanding: patient states understanding of the procedure being performed Patient consent: the patient's understanding of the procedure matches consent given Procedure consent: procedure consent matches procedure scheduled Required items: required blood products, implants, devices, and special equipment available Patient identity confirmed: verbally with patient Time out: Immediately prior to procedure a "time out" was called to verify the correct patient, procedure, equipment, support staff and site/side marked as required. Body area: head/neck Location details: lower lip Sutures Removed: 4 Post-removal: antibiotic ointment applied Facility: sutures placed in this facility Patient tolerance: Patient tolerated the procedure well with no immediate complications.   (including critical care time)     MDM   Sutures removed from an infected wound. Plan continue antibiotics. Scar precautions verbalized as understood and scar minimization instructions provided. Plan followup with primary care physician and return here for worsening condition.        Sunnie Nielsen, MD 08/27/11 [phone]

## 2011-08-27 NOTE — Discharge Instructions (Signed)
Scar Minimization Continue taking her antibiotics as discussed. Keep wound clean dry and covered. Apply Neosporin twice daily. You will have a scar anytime you have surgery and a cut is made in the skin or you have something removed from your skin (mole, skin cancer, cyst). Although scars are unavoidable following surgery, there are ways to minimize their appearance. It is important to follow all the instructions you receive from your caregiver about wound care. How your wound heals will influence the appearance of your scar. If you do not follow the wound care instructions as directed, complications such as infection may occur. Wound instructions include keeping the wound clean, moist, and not letting the wound form a scab. Some people form scars that are raised and lumpy (hypertrophic) or larger than the initial wound (keloidal). HOME CARE INSTRUCTIONS   Follow wound care instructions as directed.   Keep the wound clean by washing it with soap and water.   Keep the wound moist with provided antibiotic cream or petroleum jelly until completely healed. Moisten twice a day for about 2 weeks.   Get stitches (sutures) taken out at the scheduled time.   Avoid touching or manipulating your wound unless needed. Wash your hands thoroughly before and after touching your wound.   Follow all restrictions such as limits on exercise or work. This depends on where your scar is located.   Keep the scar protected from sunburn. Cover the scar with sunscreen/sunblock with SPF 30 or higher.   Gently massage the scar using a circular motion to help minimize the appearance of the scar. Do this only after the wound has closed and all the sutures have been removed.   For hypertrophic or keloidal scars, there are several ways to treat and minimize their appearance. Methods include compression therapy, intralesional corticosteroids, laser therapy, or surgery. These methods are performed by your caregiver.  Remember  that the scar may appear lighter or darker than your normal skin color. This difference in color should even out with time. SEEK MEDICAL CARE IF:   You have a fever.   You develop signs of infection such as pain, redness, pus, and warmth.   You have questions or concerns.  Document Released: 08/04/2009 Document Revised: 02/03/2011 Document Reviewed: 08/04/2009 [facility] Patient Information 2012 [place], [place].Staple Removal Care After The staples used to close your skin have been removed. The wound needs continued care so it can heal completely and without problems. The care described here will need to be done for another 5-10 days unless your caregiver advises otherwise.  HOME CARE INSTRUCTIONS   Keep wound site dry and clean.   If skin adhesive strips were applied after the staples were removed, they will begin to peel off in a few days. If they remain after fourteen days, they may be peeled off and discarded.   If you still have a dressing, change it at least once a day or as instructed by your caregiver. If the bandage sticks, soak it off with warm water. Pat dry with a clean towel. Look for signs of infection (see below).   Reapply cream or ointment according to your caregiver's instruction. This will help prevent infection and keep the bandage from sticking. Use of a non-stick material over the wound and under the dressing or wrap will also help keep the bandage from sticking.   If the bandage becomes wet, dirty or develops a foul smell, change it as soon as possible.   New scars become sunburned easily. Use  sunscreens with protection factor (SPF) of at least 15 when out in the sun.   Only take over-the-counter or prescription medicines for pain, discomfort or fever as directed by your caregiver.  SEEK IMMEDIATE MEDICAL CARE IF:   There is redness, swelling or increasing pain in the wound.   Pus is coming from the wound.   An unexplained oral temperature above 102 F (38.9 C)  develops.   You notice a foul smell coming from the wound or dressing.   There is a breaking open of the suture line (edges not staying together) of the wound edges after staples have been removed.  Document Released: 01/28/2008 Document Revised: 02/03/2011 Document Reviewed: 01/28/2008 [facility] Patient Information 2012 [place], [place].

## 2011-08-27 NOTE — ED Notes (Signed)
Pt presents w/ sutures that need removed.

## 2012-02-05 ENCOUNTER — Inpatient Hospital Stay (HOSPITAL_COMMUNITY)
Admission: AD | Admit: 2012-02-05 | Discharge: 2012-02-05 | Disposition: A | Discharge status: Home / Self Care | Payer: No Typology Code available for payment source | Source: Ambulatory Visit | Attending: Obstetrics & Gynecology | Admitting: Obstetrics & Gynecology

## 2012-02-05 ENCOUNTER — Encounter (HOSPITAL_COMMUNITY): Payer: Self-pay | Admitting: *Deleted

## 2012-02-05 DIAGNOSIS — L738 Other specified follicular disorders: Secondary | ICD-10-CM | POA: Insufficient documentation

## 2012-02-05 DIAGNOSIS — N949 Unspecified condition associated with female genital organs and menstrual cycle: Secondary | ICD-10-CM | POA: Insufficient documentation

## 2012-02-05 DIAGNOSIS — L739 Follicular disorder, unspecified: Secondary | ICD-10-CM

## 2012-02-05 DIAGNOSIS — A5901 Trichomonal vulvovaginitis: Secondary | ICD-10-CM | POA: Insufficient documentation

## 2012-02-05 LAB — WET PREP, GENITAL: Yeast Wet Prep HPF POC: NONE SEEN

## 2012-02-05 LAB — URINALYSIS, ROUTINE W REFLEX MICROSCOPIC
Bilirubin Urine: NEGATIVE
Ketones, ur: NEGATIVE mg/dL
Specific Gravity, Urine: >1.03 — ABNORMAL HIGH (ref 1.005–1.030)
pH: 5.5 (ref 5.0–8.0)

## 2012-02-05 MED ORDER — METRONIDAZOLE 500 MG PO TABS
2000.0000 mg | ORAL_TABLET | Freq: Once | ORAL | Status: AC
  Administered 2012-02-05: 2000 mg via ORAL
  Filled 2012-02-05: qty 4

## 2012-02-05 NOTE — MAU Provider Note (Signed)
History     CSN: [id number]  Arrival date and time: 02/05/12 4098   First Provider Initiated Contact with Patient 02/05/12 0345      Chief Complaint  Patient presents with  . Vaginal Pain   HPI  Tabitha Chambers is a 41 y.o. [phone] who presents today with a "painful bump down there". She states that it started 2 days ago, and has gotten worse.  Past Medical History  Diagnosis Date  . Hypertension     Past Surgical History  Procedure Date  . Dilation and curettage of uterus     Family History  Problem Relation Age of Onset  . Other Neg Hx     History  Substance Use Topics  . Smoking status: Never Smoker   . Smokeless tobacco: Not on file  . Alcohol Use: No     Comment: socially    Allergies: No Known Allergies  Prescriptions prior to admission  Medication Sig Dispense Refill  . hydrochlorothiazide (MICROZIDE) 12.5 MG capsule Take 12.5 mg by mouth daily.        Review of Systems  Constitutional: Negative for fever.  Gastrointestinal: Negative for nausea, vomiting and abdominal pain.  Genitourinary: Negative for dysuria, urgency and frequency.   Physical Exam   Blood pressure 159/94, pulse 125, temperature 98.4 F (36.9 C), resp. rate 20, height 5\' 7"  (1.702 m), weight 76.567 kg (168 lb 12.8 oz), last menstrual period 01/18/2012, SpO2 100.00%.  Physical Exam  Nursing note and vitals reviewed. Constitutional: She is oriented to person, place, and time. She appears well-developed and well-nourished.  Cardiovascular: Normal rate.   Respiratory: Effort normal.  GI: Soft.  Genitourinary:        External: shaved. 1cmx1cm inflamed follicle on mons. Not fluctuant. Not ready to be incised. Vagina: normal. Small amount of white discharge Cervix: pink, smooth. No CMT Uterus: AV, NSSC Adnexa: NT  Neurological: She is alert and oriented to person, place, and time.  Skin: Skin is warm and dry.  Psychiatric: She has a normal mood and affect.    MAU Course   Procedures  Results for orders placed during the hospital encounter of 02/05/12 (from the past 24 hour(s))  URINALYSIS, ROUTINE W REFLEX MICROSCOPIC     Status: Abnormal   Collection Time   02/05/12  3:15 AM      Component Value Range   Color, Urine YELLOW  YELLOW   APPearance CLEAR  CLEAR   Specific Gravity, Urine >1.030 (*) 1.005 - 1.030   pH 5.5  5.0 - 8.0   Glucose, UA NEGATIVE  NEGATIVE mg/dL   Hgb urine dipstick SMALL (*) NEGATIVE   Bilirubin Urine NEGATIVE  NEGATIVE   Ketones, ur NEGATIVE  NEGATIVE mg/dL   Protein, ur NEGATIVE  NEGATIVE mg/dL   Urobilinogen, UA 0.2  0.0 - 1.0 mg/dL   Nitrite NEGATIVE  NEGATIVE   Leukocytes, UA TRACE (*) NEGATIVE  URINE MICROSCOPIC-ADD ON     Status: Abnormal   Collection Time   02/05/12  3:15 AM      Component Value Range   Squamous Epithelial / LPF FEW (*) RARE   WBC, UA 0-2  <3 WBC/hpf   RBC / HPF 0-2  <3 RBC/hpf   Crystals CA OXALATE CRYSTALS (*) NEGATIVE   Urine-Other TRICHOMONAS PRESENT    POCT PREGNANCY, URINE     Status: Normal   Collection Time   02/05/12  3:25 AM      Component Value Range   Preg Test,  Ur NEGATIVE  NEGATIVE  WET PREP, GENITAL     Status: Abnormal   Collection Time   02/05/12  3:45 AM      Component Value Range   Yeast Wet Prep HPF POC NONE SEEN  NONE SEEN   Trich, Wet Prep MODERATE (*) NONE SEEN   Clue Cells Wet Prep HPF POC MODERATE (*) NONE SEEN   WBC, Wet Prep HPF POC FEW (*) NONE SEEN     Assessment and Plan  Folliculitis  -supportive therapy  Trichomoniasis   -2g Flagyl PO prior to DC   Tawnya Crook 02/05/2012, 3:48 AM

## 2012-02-05 NOTE — MAU Note (Signed)
I have bump on vagina that has pus in it and is painful

## 2012-02-05 NOTE — Progress Notes (Signed)
Written and verbal d/c instructions given and understanding voiced. 

## 2012-02-06 LAB — GC/CHLAMYDIA PROBE AMP: CT Probe RNA: NEGATIVE

## 2013-12-30 ENCOUNTER — Encounter (HOSPITAL_COMMUNITY): Payer: Self-pay | Admitting: *Deleted

## 2014-09-30 ENCOUNTER — Encounter (HOSPITAL_COMMUNITY): Payer: Self-pay | Admitting: Emergency Medicine

## 2014-09-30 ENCOUNTER — Emergency Department (HOSPITAL_COMMUNITY)
Admission: EM | Admit: 2014-09-30 | Discharge: 2014-09-30 | Disposition: A | Discharge status: Home / Self Care | Payer: No Typology Code available for payment source | Attending: Emergency Medicine | Admitting: Emergency Medicine

## 2014-09-30 DIAGNOSIS — W57XXXA Bitten or stung by nonvenomous insect and other nonvenomous arthropods, initial encounter: Secondary | ICD-10-CM | POA: Insufficient documentation

## 2014-09-30 DIAGNOSIS — Y998 Other external cause status: Secondary | ICD-10-CM | POA: Insufficient documentation

## 2014-09-30 DIAGNOSIS — S40261A Insect bite (nonvenomous) of right shoulder, initial encounter: Secondary | ICD-10-CM | POA: Insufficient documentation

## 2014-09-30 DIAGNOSIS — Y9389 Activity, other specified: Secondary | ICD-10-CM | POA: Insufficient documentation

## 2014-09-30 DIAGNOSIS — I10 Essential (primary) hypertension: Secondary | ICD-10-CM | POA: Insufficient documentation

## 2014-09-30 DIAGNOSIS — Y9289 Other specified places as the place of occurrence of the external cause: Secondary | ICD-10-CM | POA: Insufficient documentation

## 2014-09-30 DIAGNOSIS — S00261A Insect bite (nonvenomous) of right eyelid and periocular area, initial encounter: Secondary | ICD-10-CM | POA: Insufficient documentation

## 2014-09-30 MED ORDER — HYDROCHLOROTHIAZIDE 12.5 MG PO CAPS
12.5000 mg | ORAL_CAPSULE | Freq: Every day | ORAL | Status: DC
  Administered 2014-09-30: 12.5 mg via ORAL
  Filled 2014-09-30: qty 1

## 2014-09-30 MED ORDER — CLINDAMYCIN HCL 300 MG PO CAPS: 300.0000 mg | ORAL_CAPSULE | Freq: Three times a day (TID) | ORAL | Status: DC

## 2014-09-30 MED ORDER — HYDROCHLOROTHIAZIDE 12.5 MG PO CAPS: 12.5000 mg | ORAL_CAPSULE | Freq: Every day | ORAL | Status: AC

## 2014-09-30 MED ORDER — LORATADINE 10 MG PO TABS: 10.0000 mg | ORAL_TABLET | Freq: Every day | ORAL | Status: AC

## 2014-09-30 NOTE — ED Provider Notes (Signed)
CSN: [id number]     Arrival date & time 09/30/14  1220 History   First MD Initiated Contact with Patient 09/30/14 1347     Chief Complaint  Patient presents with  . Facial Swelling   HPI   44 year old female presents today with facial swelling. Patient reports on Monday she woke up her right eyelids swollen shut, with some swelling to the periorbital area. She reports she also had a what appeared to be insect bite on her right shoulder with minor erythema and swelling. She notes that she found a bedbug at that time. She reports she has swelling, mildly tender to palpation, no painful vision, or limited ocular movements. She reports she is otherwise feeling well, no fever, chills, rhinorrhea or signs of infection. Patient denies any history of the same. Reports using calamine lotion for the insect bite on her shoulder, has not used anything for the eye. Patient denies any specific allergies. Patient reports she does not take any medications, was prescribed HCTZ but has not taken it after running out several months ago. She denies headache, neurological deficits, changes in vision, chest pain, changes in urine color clarity or characteristics.  Past Medical History  Diagnosis Date  . Hypertension    Past Surgical History  Procedure Laterality Date  . Dilation and curettage of uterus     Family History  Problem Relation Age of Onset  . Other Neg Hx    History  Substance Use Topics  . Smoking status: Never Smoker   . Smokeless tobacco: Not on file  . Alcohol Use: No     Comment: socially   OB History    Gravida Para Term Preterm AB TAB SAB Ectopic Multiple Living   4 2 2  2  2   2      Review of Systems  All other systems reviewed and are negative.   Allergies  Review of patient's allergies indicates no known allergies.  Home Medications   Prior to Admission medications   Medication Sig Start Date End Date Taking? Authorizing Provider  clindamycin (CLEOCIN) 300 MG capsule Take  1 capsule (300 mg total) by mouth 3 (three) times daily. 09/30/14   Okey Regal, PA-C  hydrochlorothiazide (MICROZIDE) 12.5 MG capsule Take 1 capsule (12.5 mg total) by mouth daily. 09/30/14   Okey Regal, PA-C  loratadine (CLARITIN) 10 MG tablet Take 1 tablet (10 mg total) by mouth daily. 09/30/14   Janet Decesare, PA-C   BP 191/108 mmHg  Pulse 101  Temp(Src) 97.8 F (36.6 C)  Resp 20  SpO2 100%    Physical Exam  Constitutional: She is oriented to person, place, and time. She appears well-developed and well-nourished.  HENT:  Head: Normocephalic and atraumatic.  Right Ear: External ear and ear canal normal.  Left Ear: External ear and ear canal normal.  Eyes: Conjunctivae are normal. Pupils are equal, round, and reactive to light. Right eye exhibits no discharge. Left eye exhibits no discharge. No scleral icterus.  Patient has edema to the preseptal structures including eyelid and surrounding soft tissue. Patient is unable to open her right eye, has some thin white discharge from the eye. Soft tissues are mildly tender to palpation. No injection of the conjunctiva, pulses are equal round and reactive to light, extraocular movements are intact with no pain. No significant redness or swelling to preseptal structures.  Neck: Normal range of motion. No JVD present. No tracheal deviation present.  Cardiovascular: Regular rhythm, normal heart sounds and intact distal pulses.  Exam reveals no gallop and no friction rub.   No murmur heard. Pulmonary/Chest: Effort normal. No stridor.  Neurological: She is alert and oriented to person, place, and time. She has normal strength. No cranial nerve deficit or sensory deficit. Coordination normal. GCS eye subscore is 4. GCS verbal subscore is 5. GCS motor subscore is 6.  Skin:  Wheal  on the right shoulder  Psychiatric: She has a normal mood and affect. Her behavior is normal. Judgment and thought content normal.  Nursing note and vitals  reviewed.   ED Course  Procedures (including critical care time) Labs Review Labs Reviewed - No data to display  Imaging Review No results found.   EKG Interpretation None      MDM   Final diagnoses:  Insect bite    Labs:  Imaging:  Consults:  Therapeutics: HCTZ  Discharge Meds: HCTZ, clindamycin, Claritin  Assessment/Plan: Patient's presentation today most consistent with an insect bite with edema of the preseptal structures. Patient has an insect bite on her right shoulder, noticed a bedbug after awakening after symptoms started. Patient does have significant swelling and edema. Eye exam normal, no injection, extraocular movements intact with pain-free range of motion. Today's presentation likely represents insect bite with resulting edema, although not able to visualize specific area of bite consideration for preseptal cellulitis given. Highly unlikely to be orbital cellulitis, patient will be discharged home clindamycin, Benadryl at night, Claritin during the day. Patient was also found to be hypertensive, reports that she has a history of the same has not been taking her hydrochlorothiazide. Has not followed up with her primary care provider for medication refill. She has no signs or symptoms of end organ damage, she will be given a dose of her HCTZ here in the ED, with a prescription, and encouraged to follow up with her primary care provider this week for further evaluation and management of ongoing hypertension, and reevaluation of her current condition.        Okey Regal, PA-C 09/30/14 1430  Sherwood Gambler, MD 10/02/14 [phone]

## 2014-09-30 NOTE — ED Notes (Signed)
Pt sts pain and swelling to right eye; eye noted to be swollen; pt sts thinks she may have been bitten by insect

## 2014-09-30 NOTE — Discharge Instructions (Signed)
Please read attached information, please use medication as directed. Please follow-up with primary care this week for reevaluation, and evaluation of your hypertension. Please monitor for new or worsening signs or symptoms return immediately if any present.

## 2014-09-30 NOTE — ED Notes (Signed)
PT ambulated with baseline gait; VSS; A&Ox3; no signs of distress; respirations even and unlabored; skin warm and dry; no questions upon discharge.  

## 2014-09-30 NOTE — ED Notes (Signed)
MD at bedside. 

## 2017-01-06 ENCOUNTER — Other Ambulatory Visit: Payer: Self-pay | Admitting: Internal Medicine

## 2017-01-06 DIAGNOSIS — Z139 Encounter for screening, unspecified: Secondary | ICD-10-CM

## 2017-02-06 ENCOUNTER — Ambulatory Visit: Payer: Self-pay

## 2017-03-09 ENCOUNTER — Ambulatory Visit
Admission: RE | Admit: 2017-03-09 | Discharge: 2017-03-09 | Disposition: A | Discharge status: Home / Self Care | Payer: Commercial Managed Care - PPO | Source: Ambulatory Visit | Attending: Internal Medicine | Admitting: Internal Medicine

## 2017-03-09 DIAGNOSIS — Z139 Encounter for screening, unspecified: Secondary | ICD-10-CM

## 2017-04-01 ENCOUNTER — Inpatient Hospital Stay (HOSPITAL_COMMUNITY)
Admission: AD | Admit: 2017-04-01 | Discharge: 2017-04-01 | Disposition: A | Discharge status: Home / Self Care | Payer: Commercial Managed Care - PPO | Source: Ambulatory Visit | Attending: Obstetrics and Gynecology | Admitting: Obstetrics and Gynecology

## 2017-04-01 ENCOUNTER — Encounter (HOSPITAL_COMMUNITY): Payer: Self-pay | Admitting: *Deleted

## 2017-04-01 DIAGNOSIS — R1030 Lower abdominal pain, unspecified: Secondary | ICD-10-CM | POA: Diagnosis not present

## 2017-04-01 DIAGNOSIS — N938 Other specified abnormal uterine and vaginal bleeding: Secondary | ICD-10-CM | POA: Diagnosis not present

## 2017-04-01 HISTORY — DX: Anemia, unspecified: D64.9

## 2017-04-01 LAB — URINALYSIS, DIPSTICK ONLY
Bilirubin Urine: NEGATIVE
GLUCOSE, UA: NEGATIVE mg/dL
Ketones, ur: NEGATIVE mg/dL
Nitrite: NEGATIVE
Protein, ur: NEGATIVE mg/dL
pH: 5.5 (ref 5.0–8.0)

## 2017-04-01 LAB — WET PREP, GENITAL
Sperm: NONE SEEN
TRICH WET PREP: NONE SEEN
Yeast Wet Prep HPF POC: NONE SEEN

## 2017-04-01 LAB — POCT PREGNANCY, URINE: Preg Test, Ur: NEGATIVE

## 2017-04-01 MED ORDER — MEGESTROL ACETATE 40 MG PO TABS: 40.0000 mg | ORAL_TABLET | Freq: Three times a day (TID) | ORAL | 1 refills | Status: AC

## 2017-04-01 MED ORDER — KETOROLAC TROMETHAMINE 60 MG/2ML IM SOLN
60.0000 mg | Freq: Once | INTRAMUSCULAR | Status: AC
  Administered 2017-04-01: 60 mg via INTRAMUSCULAR
  Filled 2017-04-01: qty 2

## 2017-04-01 NOTE — MAU Note (Signed)
Pt reports she started having back pain and left side/flank pain since Monday . Started her period on Monday but bleeding has been heavy. No period since October.

## 2017-04-01 NOTE — MAU Provider Note (Signed)
History  47 yo female not pregnant in with bleeding since Monday. No period since October. No method of contraception. Has not had pap in "several years". Med hx pos for hypertension.   CSN: [id number]  Arrival date & time 04/01/17  2018   None     Chief Complaint  Patient presents with  . Abdominal Pain  . Vaginal Bleeding    HPI  Past Medical History:  Diagnosis Date  . Hypertension     Past Surgical History:  Procedure Laterality Date  . DILATION AND CURETTAGE OF UTERUS      Family History  Problem Relation Age of Onset  . Other Neg Hx     Social History   Tobacco Use  . Smoking status: Never Smoker  Substance Use Topics  . Alcohol use: No    Comment: socially  . Drug use: No    OB History    Gravida Para Term Preterm AB Living   4 2 2   2 2    SAB TAB Ectopic Multiple Live Births   2       2      Review of Systems  Constitutional: Negative.   HENT: Negative.   Eyes: Negative.   Respiratory: Negative.   Cardiovascular: Negative.   Gastrointestinal: Positive for abdominal pain.  Endocrine: Negative.   Genitourinary: Positive for vaginal bleeding.  Musculoskeletal: Negative.   Skin: Negative.   Allergic/Immunologic: Negative.   Neurological: Negative.   Hematological: Negative.   Psychiatric/Behavioral: Negative.     Allergies  Patient has no known allergies.  Home Medications    LMP 12/14/2016   Physical Exam  Constitutional: She is oriented to person, place, and time. She appears well-developed and well-nourished.  HENT:  Head: Normocephalic.  Neck: Normal range of motion.  Cardiovascular: Normal rate, regular rhythm and normal heart sounds.  Pulmonary/Chest: Effort normal and breath sounds normal.  Abdominal: Soft. Bowel sounds are normal.  Genitourinary: Vagina normal and uterus normal.  Musculoskeletal: Normal range of motion.  Neurological: She is alert and oriented to person, place, and time. She has normal reflexes.  Skin:  Skin is warm and dry.  Psychiatric: She has a normal mood and affect. Her behavior is normal. Judgment and thought content normal.    MAU Course  Procedures (including critical care time)  Labs Reviewed  WET PREP, GENITAL  URINALYSIS, DIPSTICK ONLY  POCT PREGNANCY, URINE  GC/CHLAMYDIA PROBE AMP ([place]) NOT AT [facility]   No results found.   1. DUB (dysfunctional uterine bleeding)   2. Lower abdominal pain       MDM  VSS, wet prep. Cultures done. sm amt bright vag bleeding. Exam WNL. Discussed need for physical exam and eval in GYN clinic and to discuss her treatment options. Pt receptive to that. Will message clinic to get pt in for assessment. Will treat megace 40 TID and d/c home.

## 2017-04-03 LAB — GC/CHLAMYDIA PROBE AMP (~~LOC~~) NOT AT ARMC
Chlamydia: NEGATIVE
Neisseria Gonorrhea: NEGATIVE

## 2017-04-10 ENCOUNTER — Encounter: Payer: Self-pay | Admitting: Obstetrics and Gynecology

## 2017-04-10 ENCOUNTER — Ambulatory Visit (INDEPENDENT_AMBULATORY_CARE_PROVIDER_SITE_OTHER): Payer: Commercial Managed Care - PPO | Admitting: Obstetrics and Gynecology

## 2017-04-10 VITALS — BP 150/61 | HR 88 | Ht 67.0 in | Wt 195.3 lb

## 2017-04-10 DIAGNOSIS — Z1151 Encounter for screening for human papillomavirus (HPV): Secondary | ICD-10-CM

## 2017-04-10 DIAGNOSIS — Z124 Encounter for screening for malignant neoplasm of cervix: Secondary | ICD-10-CM | POA: Diagnosis not present

## 2017-04-10 DIAGNOSIS — N938 Other specified abnormal uterine and vaginal bleeding: Secondary | ICD-10-CM

## 2017-04-10 DIAGNOSIS — Z01419 Encounter for gynecological examination (general) (routine) without abnormal findings: Secondary | ICD-10-CM

## 2017-04-10 DIAGNOSIS — D259 Leiomyoma of uterus, unspecified: Secondary | ICD-10-CM | POA: Diagnosis not present

## 2017-04-10 NOTE — Progress Notes (Signed)
GYNECOLOGY OFFICE FOLLOW UP NOTE  History:  47 y.o. [id number] here today for follow up for heavy bleeding, was seen at [facility] on 04/03/17. She has been having bleeding for one month. Last normal period was 11/2016, lasting 3 days.  Prior to that, she was having regular, monthly periods lasting 3 days. This is the first time she has had an irregular period. This month she has had periods of increased and decreased flow. Also with cramping until about a week ago.    Past Medical History:  Diagnosis Date  . Anemia   . Hypertension    Past Surgical History:  Procedure Laterality Date  . DILATION AND CURETTAGE OF UTERUS      Current Outpatient Medications:  .  clindamycin (CLEOCIN) 300 MG capsule, Take 1 capsule (300 mg total) by mouth 3 (three) times daily., Disp: 21 capsule, Rfl: 0 .  hydrochlorothiazide (MICROZIDE) 12.5 MG capsule, Take 1 capsule (12.5 mg total) by mouth daily., Disp: 30 capsule, Rfl: 0 .  loratadine (CLARITIN) 10 MG tablet, Take 1 tablet (10 mg total) by mouth daily., Disp: 30 tablet, Rfl: 0 .  megestrol (MEGACE) 40 MG tablet, Take 1 tablet (40 mg total) by mouth 3 (three) times daily., Disp: 90 tablet, Rfl: 1  The following portions of the patient's history were reviewed and updated as appropriate: allergies, current medications, past family history, past medical history, past social history, past surgical history and problem list.   Review of Systems:  Pertinent items noted in HPI and remainder of comprehensive ROS otherwise negative.   Objective:  Physical Exam BP (!) 150/61   Pulse 88   Ht 5\' 7"  (1.702 m)   Wt 195 lb 4.8 oz (88.6 kg)   LMP 03/12/2017 (Approximate)   BMI 30.59 kg/m  CONSTITUTIONAL: Well-developed, well-nourished female in no acute distress.  HENT:  Normocephalic, atraumatic. External right and left ear normal. Oropharynx is clear and moist EYES: Conjunctivae and EOM are normal. Pupils are equal, round, and reactive to light. No scleral icterus.    NECK: Normal range of motion, supple, no masses SKIN: Skin is warm and dry. No rash noted. Not diaphoretic. No erythema. No pallor. NEUROLOGIC: Alert and oriented to person, place, and time. Normal reflexes, muscle tone coordination. No cranial nerve deficit noted. PSYCHIATRIC: Normal mood and affect. Normal behavior. Normal judgment and thought content. CARDIOVASCULAR: Normal heart rate noted RESPIRATORY: Effort and breath sounds normal, no problems with respiration noted ABDOMEN: Soft, no distention noted.   PELVIC: normal appearing external female genitalia, bright red blood in vagina, normal appearing cervix, pap smear obtained  Bimanual: large, bulky uterus up to 18 weeks sized, no adnexal tenderness or discomfort MUSCULOSKELETAL: Normal range of motion. No edema noted.  Labs and Imaging No results found.  Assessment & Plan:   1. DUB (dysfunctional uterine bleeding) - US Transvaginal Non-OB; Future - US Pelvis Complete; Future - reviewed possible etiology of bleeding, malignancy vs polyp vs fibroids, reviewed recommendation to obtain pelvic US as well as endometrial sampling. Reviewed options for medical management including megace to control bleeding for now, possible IUD placement, surgical management. She will obtain TVUS, return for EMB and start megace. Is interested in surgical management  2. Uterine leiomyoma, unspecified location   Routine preventative health maintenance measures emphasized. Please refer to After Visit Summary for other counseling recommendations.   Return in about 2 weeks (around 04/24/2017).    Feliz Beam, M.D. Attending [place], [facility] for [employer],  [place]

## 2017-04-13 ENCOUNTER — Emergency Department (HOSPITAL_COMMUNITY)
Admission: EM | Admit: 2017-04-13 | Discharge: 2017-04-13 | Disposition: A | Discharge status: Home / Self Care | Payer: Worker's Compensation | Attending: Emergency Medicine | Admitting: Emergency Medicine

## 2017-04-13 ENCOUNTER — Emergency Department (HOSPITAL_COMMUNITY): Payer: Worker's Compensation

## 2017-04-13 ENCOUNTER — Encounter (HOSPITAL_COMMUNITY): Payer: Self-pay

## 2017-04-13 DIAGNOSIS — R1011 Right upper quadrant pain: Secondary | ICD-10-CM | POA: Insufficient documentation

## 2017-04-13 DIAGNOSIS — Z79899 Other long term (current) drug therapy: Secondary | ICD-10-CM | POA: Diagnosis not present

## 2017-04-13 DIAGNOSIS — I10 Essential (primary) hypertension: Secondary | ICD-10-CM | POA: Diagnosis not present

## 2017-04-13 DIAGNOSIS — Y9389 Activity, other specified: Secondary | ICD-10-CM | POA: Diagnosis not present

## 2017-04-13 DIAGNOSIS — M25541 Pain in joints of right hand: Secondary | ICD-10-CM | POA: Diagnosis not present

## 2017-04-13 DIAGNOSIS — Z23 Encounter for immunization: Secondary | ICD-10-CM | POA: Diagnosis not present

## 2017-04-13 DIAGNOSIS — Y999 Unspecified external cause status: Secondary | ICD-10-CM | POA: Diagnosis not present

## 2017-04-13 DIAGNOSIS — Y92410 Unspecified street and highway as the place of occurrence of the external cause: Secondary | ICD-10-CM | POA: Insufficient documentation

## 2017-04-13 LAB — COMPREHENSIVE METABOLIC PANEL
ALT: 17 U/L (ref 14–54)
AST: 34 U/L (ref 15–41)
Albumin: 3.8 g/dL (ref 3.5–5.0)
Alkaline Phosphatase: 84 U/L (ref 38–126)
Anion gap: 10 (ref 5–15)
BUN: 6 mg/dL (ref 6–20)
CO2: 21 mmol/L — ABNORMAL LOW (ref 22–32)
CREATININE: 0.79 mg/dL (ref 0.44–1.00)
Calcium: 8.7 mg/dL — ABNORMAL LOW (ref 8.9–10.3)
Chloride: 107 mmol/L (ref 101–111)
GFR calc Af Amer: >60 mL/min (ref >60)
Glucose, Bld: 113 mg/dL — ABNORMAL HIGH (ref 65–99)
POTASSIUM: 3.5 mmol/L (ref 3.5–5.1)
Sodium: 138 mmol/L (ref 135–145)
TOTAL PROTEIN: 7.4 g/dL (ref 6.5–8.1)
Total Bilirubin: 0.5 mg/dL (ref 0.3–1.2)

## 2017-04-13 LAB — CBC
HEMATOCRIT: 30.8 % — AB (ref 36.0–46.0)
Hemoglobin: 9.6 g/dL — ABNORMAL LOW (ref 12.0–15.0)
MCH: 25.2 pg — ABNORMAL LOW (ref 26.0–34.0)
MCHC: 31.2 g/dL (ref 30.0–36.0)
MCV: 80.8 fL (ref 78.0–100.0)
PLATELETS: 446 10*3/uL — AB (ref 150–400)
RBC: 3.81 MIL/uL — AB (ref 3.87–5.11)
RDW: 17.7 % — AB (ref 11.5–15.5)
WBC: 12.3 10*3/uL — AB (ref 4.0–10.5)

## 2017-04-13 LAB — URINALYSIS, ROUTINE W REFLEX MICROSCOPIC
BILIRUBIN URINE: NEGATIVE
Bacteria, UA: NONE SEEN
GLUCOSE, UA: NEGATIVE mg/dL
KETONES UR: NEGATIVE mg/dL
LEUKOCYTES UA: NEGATIVE
NITRITE: NEGATIVE
PH: 6 (ref 5.0–8.0)
Protein, ur: NEGATIVE mg/dL
Specific Gravity, Urine: 1.009 (ref 1.005–1.030)

## 2017-04-13 LAB — CYTOLOGY - PAP
Diagnosis: NEGATIVE
HPV: NOT DETECTED

## 2017-04-13 MED ORDER — IOPAMIDOL (ISOVUE-300) INJECTION 61%
INTRAVENOUS | Status: AC
  Administered 2017-04-13: 100 mL
  Filled 2017-04-13: qty 100

## 2017-04-13 MED ORDER — OXYCODONE-ACETAMINOPHEN 5-325 MG PO TABS
1.0000 | ORAL_TABLET | Freq: Once | ORAL | Status: AC
  Administered 2017-04-13: 1 via ORAL
  Filled 2017-04-13: qty 1

## 2017-04-13 MED ORDER — TETANUS-DIPHTH-ACELL PERTUSSIS 5-2.5-18.5 LF-MCG/0.5 IM SUSP
0.5000 mL | Freq: Once | INTRAMUSCULAR | Status: AC
  Administered 2017-04-13: 0.5 mL via INTRAMUSCULAR
  Filled 2017-04-13: qty 0.5

## 2017-04-13 NOTE — ED Notes (Signed)
Patient transported to CT 

## 2017-04-13 NOTE — Discharge Instructions (Signed)
CT scan of your abdomen is reassuring. You do not have any broken bones in your right hand.   Please ice your side.   Take ibuprofen for pain.  Return to the ER if you have any new or concerning symptoms including worsening of your stomach pain, vomiting that will not stop.

## 2017-04-13 NOTE — ED Notes (Signed)
Pt out of bed to bathroom . 

## 2017-04-13 NOTE — ED Notes (Signed)
IV attempted without success. Labs drawn.
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
## 2017-04-13 NOTE — ED Notes (Signed)
On arrival to room pt to go to bathroom. Pt ambulates to same.
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
## 2017-04-13 NOTE — ED Notes (Signed)
Lac at right elbow cleaned and dressed with bandaid.

## 2017-04-13 NOTE — ED Notes (Signed)
Pt states she understands instructions. Home stabled with family. Via WC.

## 2017-04-14 ENCOUNTER — Ambulatory Visit (HOSPITAL_COMMUNITY): Payer: Commercial Managed Care - PPO | Attending: Obstetrics and Gynecology

## 2017-04-18 ENCOUNTER — Telehealth: Payer: Self-pay | Admitting: General Practice
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
## 2017-04-18 NOTE — Telephone Encounter (Signed)
-----   Message from Sloan Leiter, MD sent at 04/13/2017  1:15 PM EST ----- Negative pap, repeat 3 years

## 2017-04-21 ENCOUNTER — Ambulatory Visit (HOSPITAL_COMMUNITY)
Admission: RE | Admit: 2017-04-21 | Discharge: 2017-04-21 | Disposition: A | Discharge status: Home / Self Care | Payer: Commercial Managed Care - PPO | Source: Ambulatory Visit | Attending: Obstetrics and Gynecology | Admitting: Obstetrics and Gynecology

## 2017-04-21 DIAGNOSIS — D259 Leiomyoma of uterus, unspecified: Secondary | ICD-10-CM | POA: Diagnosis not present

## 2017-04-21 DIAGNOSIS — N938 Other specified abnormal uterine and vaginal bleeding: Secondary | ICD-10-CM | POA: Insufficient documentation

## 2017-04-21 DIAGNOSIS — N7011 Chronic salpingitis: Secondary | ICD-10-CM | POA: Diagnosis not present

## 2017-05-01 ENCOUNTER — Ambulatory Visit: Payer: Commercial Managed Care - PPO | Admitting: Obstetrics and Gynecology

## 2018-12-30 ENCOUNTER — Ambulatory Visit (HOSPITAL_COMMUNITY)
Admission: EM | Admit: 2018-12-30 | Discharge: 2018-12-30 | Disposition: A | Discharge status: Home / Self Care | Payer: Commercial Managed Care - PPO | Attending: Emergency Medicine | Admitting: Emergency Medicine

## 2018-12-30 ENCOUNTER — Other Ambulatory Visit: Payer: Self-pay

## 2018-12-30 ENCOUNTER — Encounter (HOSPITAL_COMMUNITY): Payer: Self-pay

## 2018-12-30 DIAGNOSIS — M25562 Pain in left knee: Secondary | ICD-10-CM

## 2018-12-30 MED ORDER — PREDNISONE 50 MG PO TABS: 50.0000 mg | ORAL_TABLET | Freq: Every day | ORAL | 0 refills | Status: AC

## 2018-12-30 NOTE — Discharge Instructions (Signed)
Begin prednisone daily for 5 days, take with food and in the morning After finishing prednisone resume Use tylenol/ibuprofen for pain/swelling. You may take up to 800 mg Ibuprofen every 8 hours with food. You may supplement Ibuprofen with Tylenol [id number] mg every 8 hours.  Ice and elevate If symptoms continue please follow up with sports medicine/ortho for further imaging
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
## 2019-01-07 ENCOUNTER — Ambulatory Visit: Payer: Commercial Managed Care - PPO | Admitting: Family Medicine

## 2019-04-09 ENCOUNTER — Ambulatory Visit: Payer: Commercial Managed Care - PPO | Attending: Internal Medicine

## 2019-04-09 DIAGNOSIS — Z20822 Contact with and (suspected) exposure to covid-19: Secondary | ICD-10-CM

## 2019-04-10 LAB — NOVEL CORONAVIRUS, NAA: SARS-CoV-2, NAA: NOT DETECTED

## 2019-04-15 ENCOUNTER — Telehealth: Payer: Self-pay | Admitting: General Practice

## 2019-04-15 NOTE — Telephone Encounter (Signed)
Patient requesting covid-19 result faxed to her employer.    Fax#  [phone] Attn: Audley Hose

## 2019-04-15 NOTE — Telephone Encounter (Signed)
Covid-19 test results faxed as per patient's request to her employer -  Attn:  Andria Frames at fax [phone].  Fax successfully transmitted.

## 2019-05-06 ENCOUNTER — Ambulatory Visit
Admission: RE | Admit: 2019-05-06 | Discharge: 2019-05-06 | Disposition: A | Discharge status: Home / Self Care | Payer: Commercial Managed Care - PPO | Source: Ambulatory Visit

## 2019-05-06 ENCOUNTER — Other Ambulatory Visit: Payer: Self-pay

## 2019-05-06 ENCOUNTER — Other Ambulatory Visit: Payer: Self-pay | Admitting: Internal Medicine

## 2019-05-06 DIAGNOSIS — Z1231 Encounter for screening mammogram for malignant neoplasm of breast: Secondary | ICD-10-CM

## 2019-05-08 ENCOUNTER — Other Ambulatory Visit: Payer: Self-pay | Admitting: Internal Medicine

## 2019-05-08 DIAGNOSIS — R928 Other abnormal and inconclusive findings on diagnostic imaging of breast: Secondary | ICD-10-CM

## 2019-05-22 ENCOUNTER — Other Ambulatory Visit: Payer: Self-pay | Admitting: Internal Medicine

## 2019-05-22 ENCOUNTER — Other Ambulatory Visit: Payer: Self-pay

## 2019-05-22 ENCOUNTER — Ambulatory Visit
Admission: RE | Admit: 2019-05-22 | Discharge: 2019-05-22 | Disposition: A | Discharge status: Home / Self Care | Payer: Commercial Managed Care - PPO | Source: Ambulatory Visit | Attending: Internal Medicine | Admitting: Internal Medicine

## 2019-05-22 DIAGNOSIS — R921 Mammographic calcification found on diagnostic imaging of breast: Secondary | ICD-10-CM

## 2019-05-22 DIAGNOSIS — R928 Other abnormal and inconclusive findings on diagnostic imaging of breast: Secondary | ICD-10-CM

## 2020-02-24 ENCOUNTER — Other Ambulatory Visit: Payer: Commercial Managed Care - PPO

## 2021-09-28 ENCOUNTER — Other Ambulatory Visit: Payer: Self-pay | Admitting: Family Medicine

## 2021-09-28 DIAGNOSIS — Z1231 Encounter for screening mammogram for malignant neoplasm of breast: Secondary | ICD-10-CM

## 2021-10-04 ENCOUNTER — Other Ambulatory Visit: Payer: Self-pay | Admitting: Family Medicine

## 2021-10-04 ENCOUNTER — Ambulatory Visit: Payer: Commercial Managed Care - PPO

## 2021-10-04 DIAGNOSIS — R921 Mammographic calcification found on diagnostic imaging of breast: Secondary | ICD-10-CM

## 2021-10-25 ENCOUNTER — Ambulatory Visit
Admission: RE | Admit: 2021-10-25 | Discharge: 2021-10-25 | Disposition: A | Discharge status: Home / Self Care | Payer: Commercial Managed Care - PPO | Source: Ambulatory Visit | Attending: Family Medicine | Admitting: Family Medicine

## 2021-10-25 DIAGNOSIS — R921 Mammographic calcification found on diagnostic imaging of breast: Secondary | ICD-10-CM

## 2022-01-17 IMAGING — MG DIGITAL SCREENING BILAT W/ TOMO W/ CAD
8 series · 8 of 24 positions shown · non-contrast
Comparison: Previous exam(s).

CLINICAL DATA: Screening.

EXAM:
DIGITAL SCREENING BILATERAL MAMMOGRAM WITH TOMO AND CAD

[L MLO synth-2D]
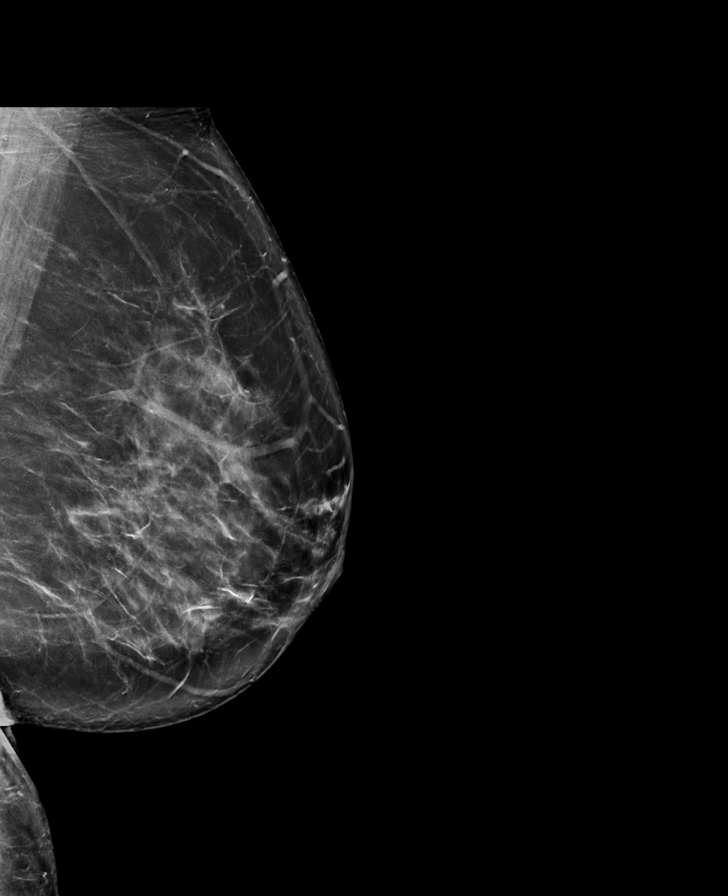

[R MLO synth-2D]
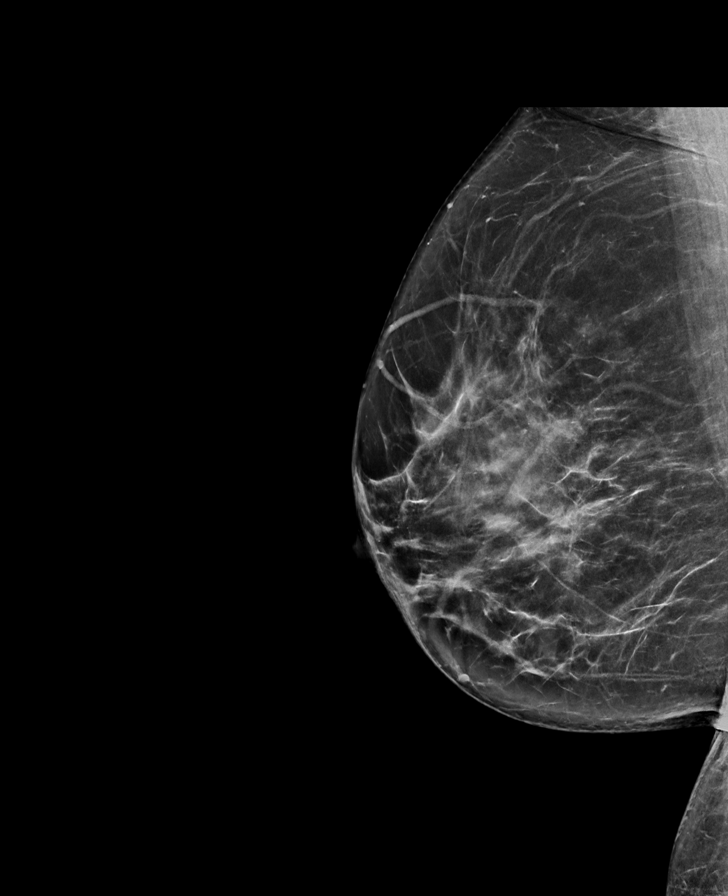

[L CC synth-2D]
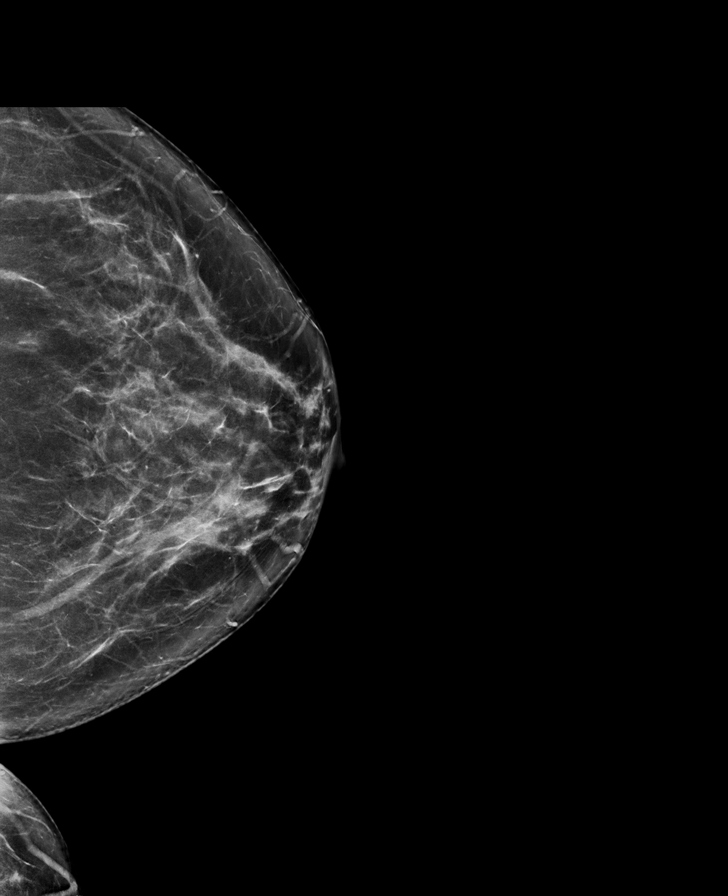

[R CC synth-2D]
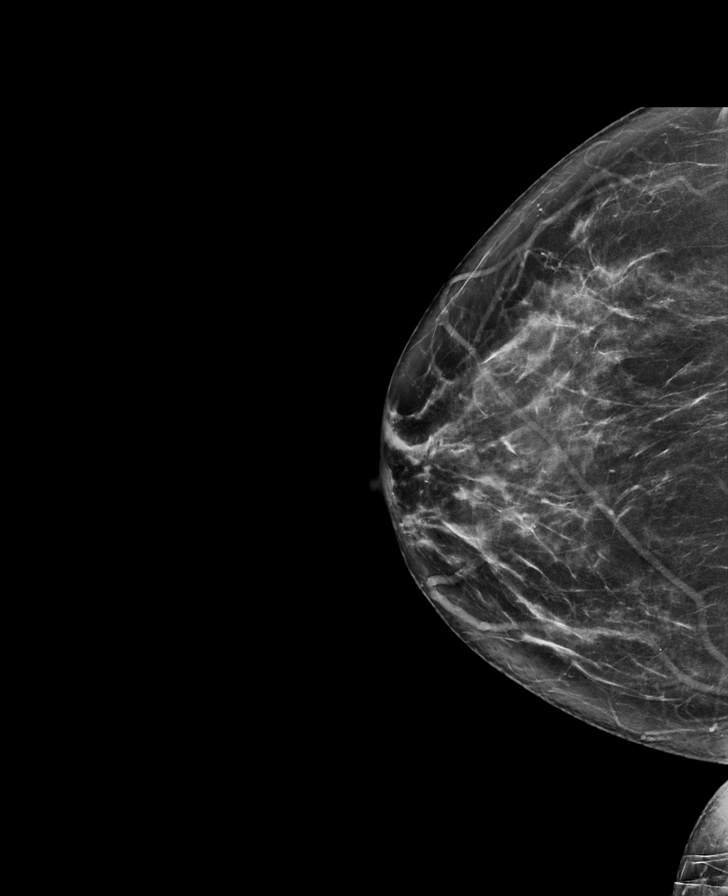

[L MLO tomo · tomo slice 44/87.0]
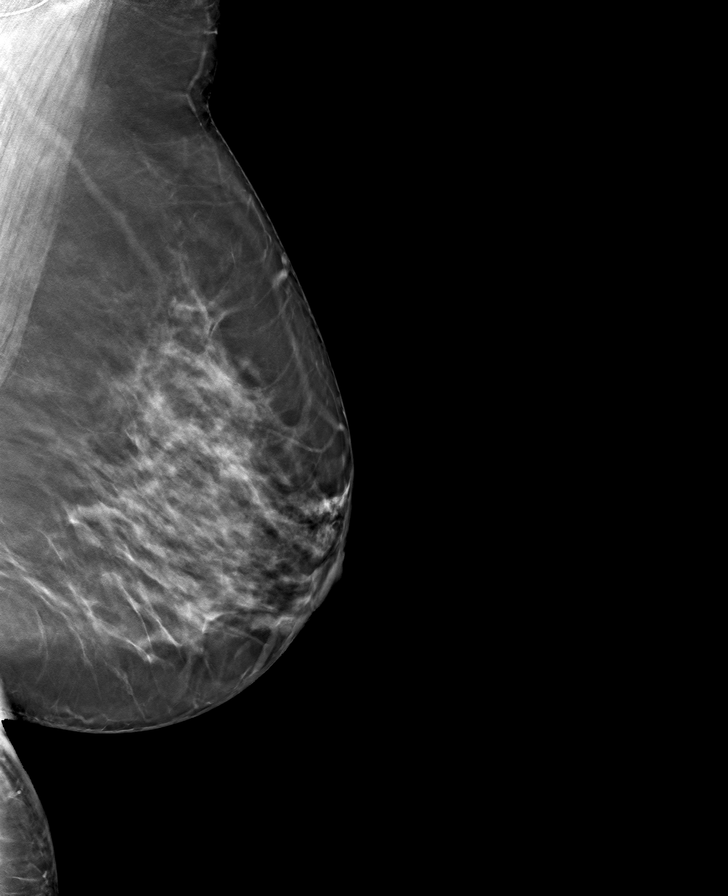

[R MLO tomo · tomo slice 41/82.0]
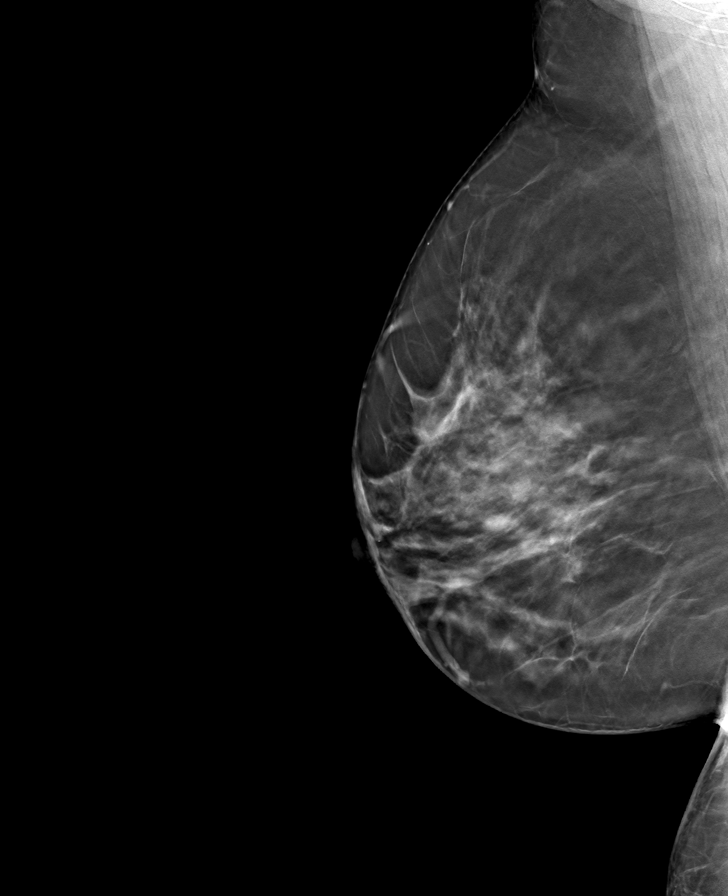

[R CC tomo · tomo slice 39/76.0]
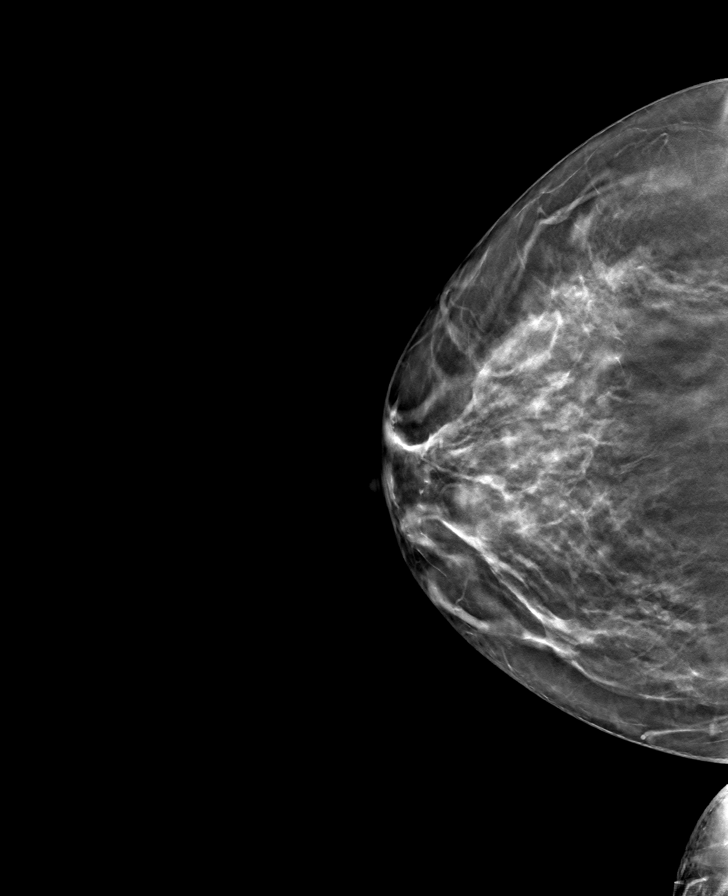

[L CC tomo · tomo slice 41/81.0]
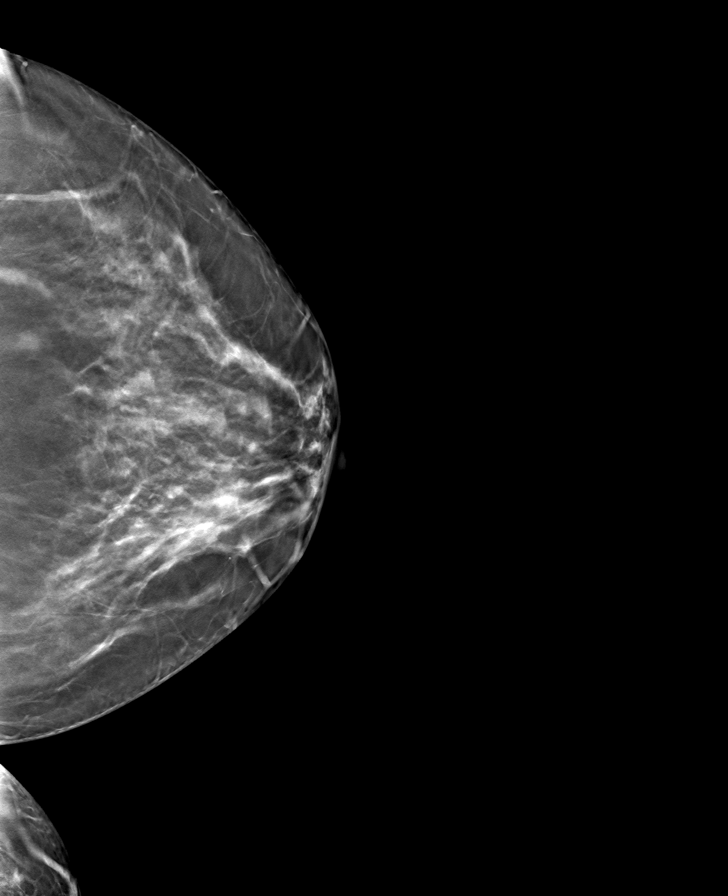

[8 of 24 positions shown; findings below may reference images not displayed]

ACR Breast Density Category c: The breast tissue is heterogeneously
dense, which may obscure small masses.
FINDINGS: In the left breast, calcifications warrant further evaluation. In
the right breast, no findings suspicious for malignancy. Images were
processed with CAD.
IMPRESSION: Further evaluation is suggested for calcifications in the left
breast.

RECOMMENDATION:
Diagnostic mammogram of the left breast. (Code:[id number])

The patient will be contacted regarding the findings, and additional
imaging will be scheduled.

BI-RADS CATEGORY  0: Incomplete. Need additional imaging evaluation
and/or prior mammograms for comparison.

## 2022-02-02 IMAGING — MG DIGITAL DIAGNOSTIC UNILAT LEFT W/ CAD
3 series · 3 of 3 positions shown · non-contrast
Comparison: Previous exam(s).

CLINICAL DATA: Patient was called back from screening mammogram for
left breast calcifications.

EXAM:
DIGITAL DIAGNOSTIC LEFT MAMMOGRAM WITH CAD

[L ML (1 of 2)]
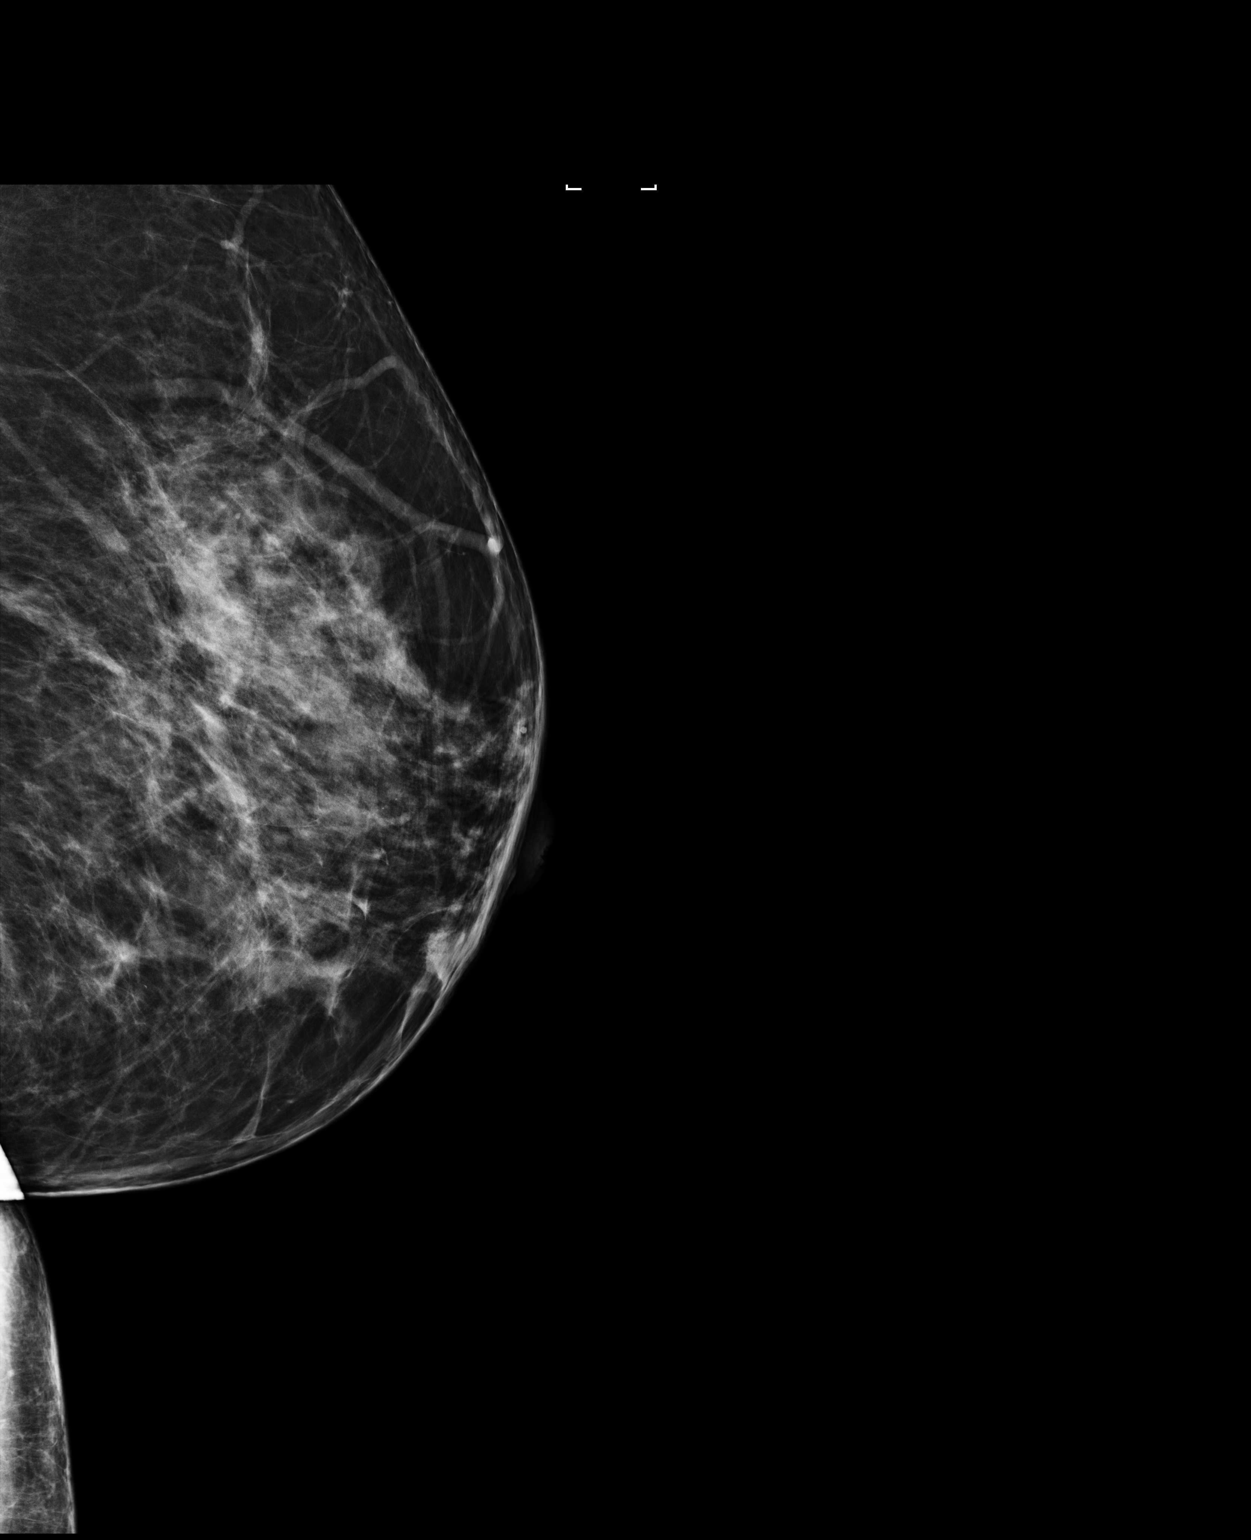

[L ML (2 of 2)]
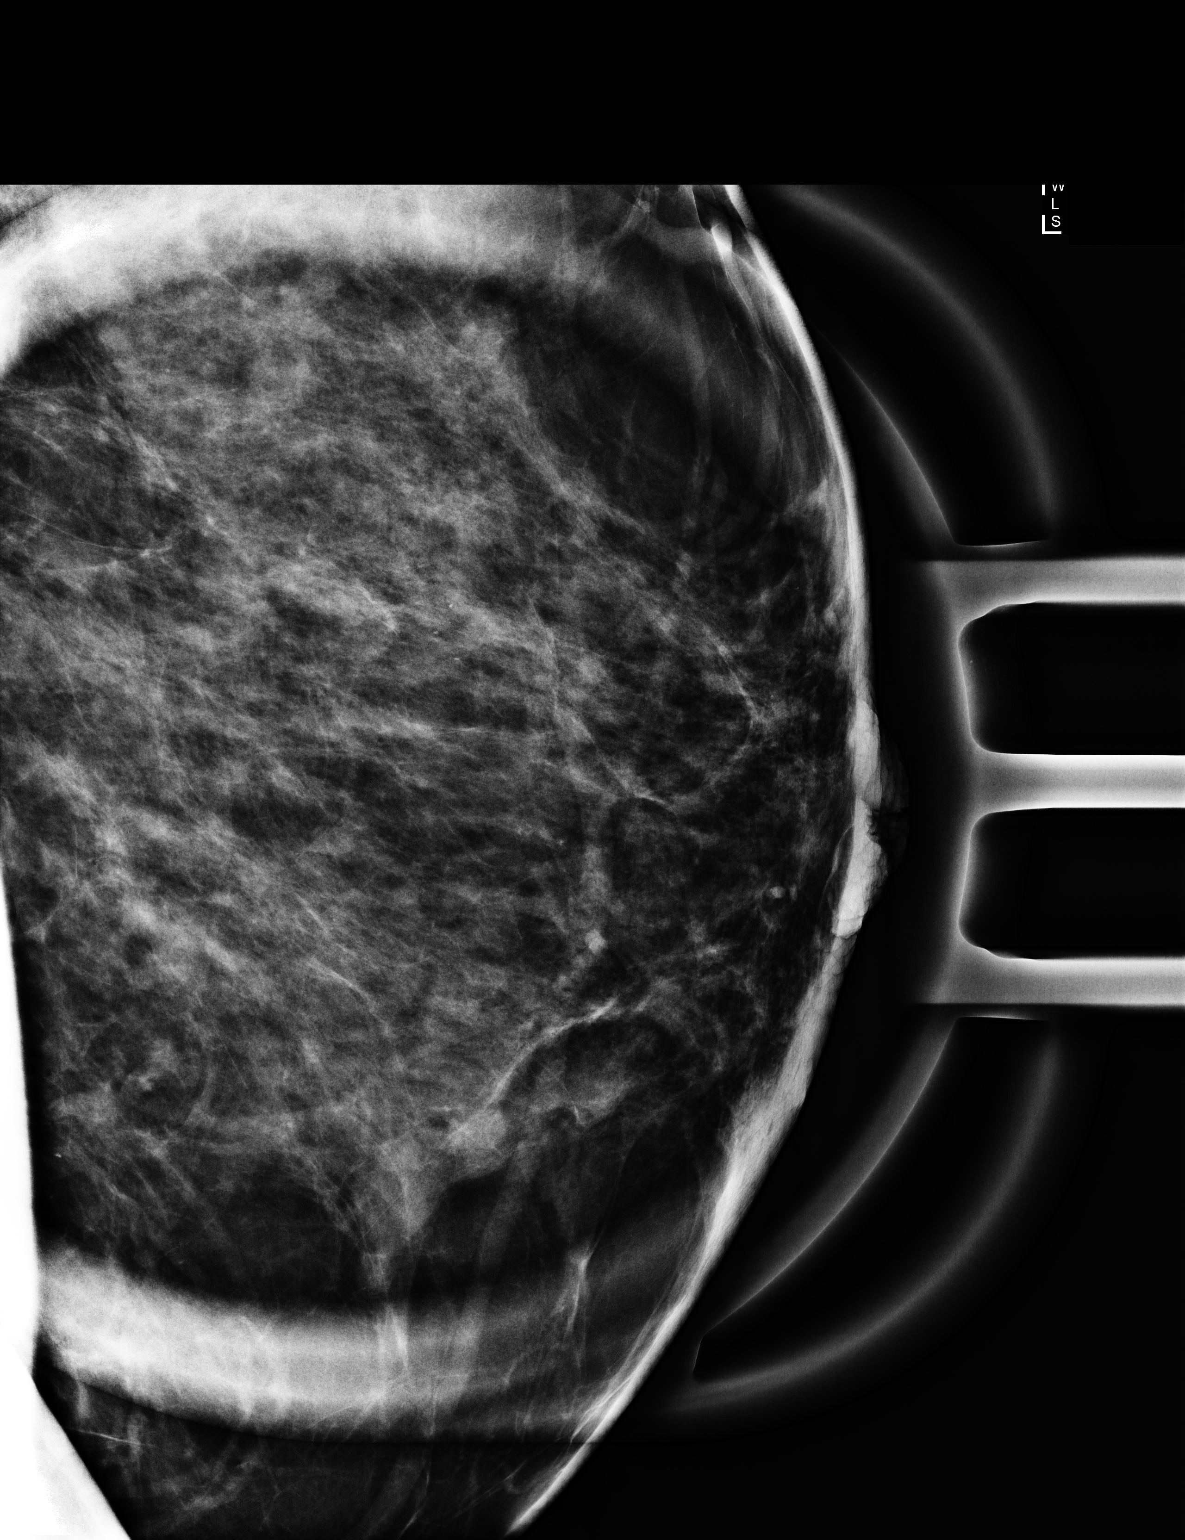

[L CC]
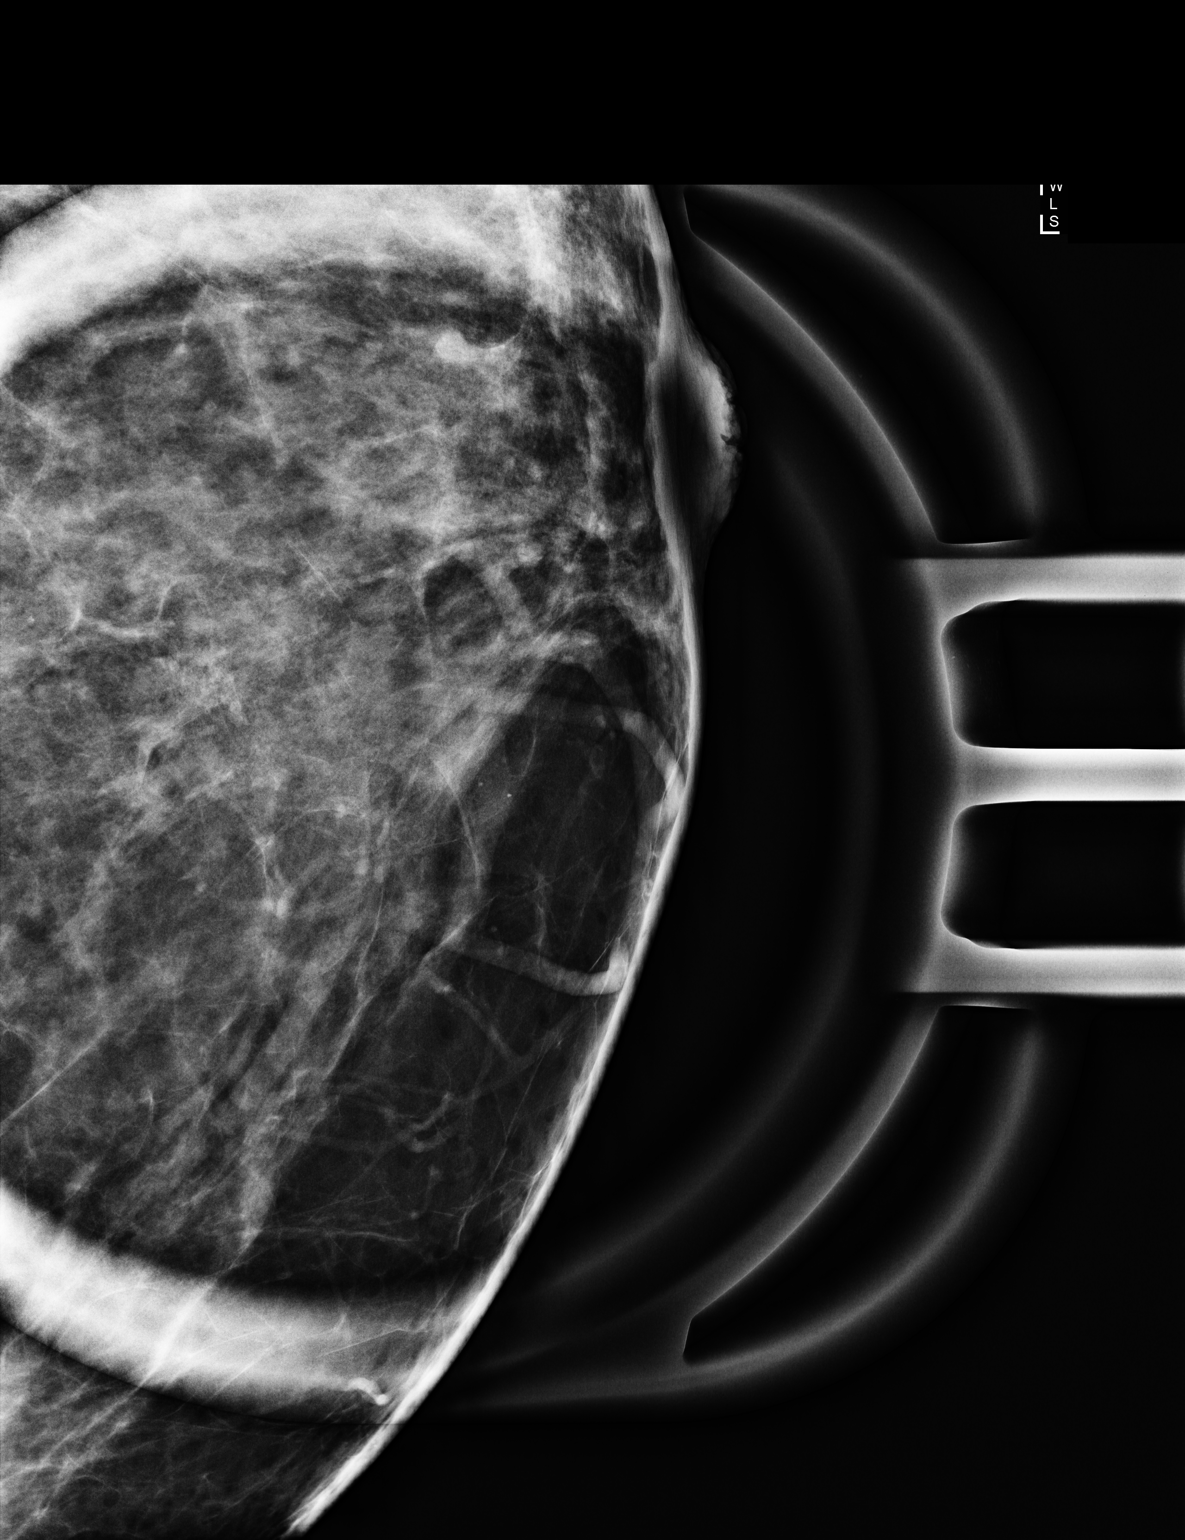

[3 of 3 positions shown; findings below may reference images not displayed]

ACR Breast Density Category c: The breast tissue is heterogeneously
dense, which may obscure small masses.
FINDINGS: Additional imaging of the left breast was performed. In the superior
aspect of the left breast there are several scattered punctate
calcifications that are felt to likely be benign. There is no
suspicious mass.

Mammographic images were processed with CAD.
IMPRESSION: Probable benign calcifications in the left breast.

RECOMMENDATION:
Short-term interval follow-up left mammogram in 6 months is
recommended.

I have discussed the findings and recommendations with the patient.
If applicable, a reminder letter will be sent to the patient
regarding the next appointment.

BI-RADS CATEGORY  3: Probably benign.

## 2022-04-11 NOTE — Progress Notes (Signed)
Called patient to do pre-op phone call. She stated that she was going to cancel her surgery for a hysterectomy on 04/19/22 @ [facility]. She said she had some problems going on with her liver that she needed to get taken care of first. I instructed her to call her surgeon's office and let them know.

## 2022-04-14 ENCOUNTER — Encounter (HOSPITAL_COMMUNITY): Admission: RE | Admit: 2022-04-14 | Payer: Commercial Managed Care - PPO | Source: Ambulatory Visit

## 2022-04-19 ENCOUNTER — Ambulatory Visit (HOSPITAL_BASED_OUTPATIENT_CLINIC_OR_DEPARTMENT_OTHER): Admit: 2022-04-19 | Payer: Commercial Managed Care - PPO | Admitting: Obstetrics and Gynecology

## 2022-04-19 DIAGNOSIS — Z01818 Encounter for other preprocedural examination: Secondary | ICD-10-CM

## 2022-04-19 SURGERY — XI ROBOTIC ASSISTED LAPAROSCOPIC HYSTERECTOMY AND SALPINGECTOMY
Anesthesia: Choice | Laterality: Bilateral

## 2023-03-18 ENCOUNTER — Telehealth: Payer: Commercial Managed Care - PPO | Admitting: Physician Assistant

## 2023-03-18 DIAGNOSIS — Z5321 Procedure and treatment not carried out due to patient leaving prior to being seen by health care provider: Secondary | ICD-10-CM

## 2023-03-18 NOTE — Progress Notes (Signed)
I attempted to troubleshoot the patients audio and called the patient 3 times via telephone with no answer. Will not charge for this visit.

## 2023-03-18 NOTE — Patient Instructions (Signed)
 Marland Kitchen

## 2023-08-16 ENCOUNTER — Encounter: Payer: Self-pay | Admitting: Physician Assistant

## 2023-08-16 ENCOUNTER — Other Ambulatory Visit: Payer: Self-pay | Admitting: Physician Assistant

## 2023-08-16 DIAGNOSIS — Z1231 Encounter for screening mammogram for malignant neoplasm of breast: Secondary | ICD-10-CM

## 2024-01-04 ENCOUNTER — Ambulatory Visit
Admission: RE | Admit: 2024-01-04 | Discharge: 2024-01-04 | Disposition: A | Discharge status: Home / Self Care | Source: Ambulatory Visit | Attending: Family Medicine | Admitting: Family Medicine

## 2024-01-04 VITALS — BP 124/69 | HR 85 | Temp 98.3°F | Resp 16

## 2024-01-04 DIAGNOSIS — L03011 Cellulitis of right finger: Secondary | ICD-10-CM

## 2024-01-04 MED ORDER — DOXYCYCLINE HYCLATE 100 MG PO CAPS: 100.0000 mg | ORAL_CAPSULE | Freq: Two times a day (BID) | ORAL | 0 refills | Status: AC

## 2024-01-04 NOTE — Discharge Instructions (Addendum)
 Advised patient take medication as directed with food to completion.  Encouraged increase daily water intake to 64 ounces per day while taking this medication.  Advised if symptoms worsen and/or unresolved please follow-up with your PCP or here for further evaluation.

## 2024-01-04 NOTE — ED Triage Notes (Signed)
 Pt presents to UC for c/o right thumb pain x2 days. She reports some swelling which has gotten better and difficulty bending the thumb. She denies injury to the thumb. Tylenol  helped some.

## 2024-01-04 NOTE — ED Provider Notes (Signed)
 Tabitha Chambers [facility]    CSN: [id number] Arrival date & time: 01/04/24  1417      History   Chief Complaint Chief Complaint  Patient presents with   Finger Injury    Swollen, warmp ,painful - Entered by patient    HPI Tabitha Chambers is a 53 y.o. female.   HPI 61-year-old female presents with right thumb pain for 2 days.  Patient denies injury or insult to right thumb PMH significant for obesity, HTN, and anemia  Past Medical History:  Diagnosis Date   Anemia    Hypertension     There are no active problems to display for this patient.   Past Surgical History:  Procedure Laterality Date   DILATION AND CURETTAGE OF UTERUS      OB History     Gravida  4   Para  2   Term  2   Preterm      AB  2   Living  2      SAB  2   IAB      Ectopic      Multiple      Live Births  2            Home Medications    Prior to Admission medications   Medication Sig Start Date End Date Taking? Authorizing Provider  doxycycline (VIBRAMYCIN) 100 MG capsule Take 1 capsule (100 mg total) by mouth 2 (two) times daily for 7 days. 01/04/24 01/11/24 Yes Teddy Sharper, FNP  hydrochlorothiazide  (MICROZIDE ) 12.5 MG capsule Take 1 capsule (12.5 mg total) by mouth daily. 09/30/14  Yes Hedges, Reyes, PA-C  loratadine  (CLARITIN ) 10 MG tablet Take 1 tablet (10 mg total) by mouth daily. 09/30/14   Hedges, Reyes, PA-C  megestrol  (MEGACE ) 40 MG tablet Take 1 tablet (40 mg total) by mouth 3 (three) times daily. 04/01/17   Gerlean Earnie JONETTA, CNM    Family History Family History  Problem Relation Age of Onset   Other Neg Hx     Social History Social History   Tobacco Use   Smoking status: Never   Smokeless tobacco: Never  Substance Use Topics   Alcohol use: No    Comment: socially   Drug use: No     Allergies   Patient has no known allergies.   Review of Systems Review of Systems   Physical Exam Triage Vital Signs ED Triage Vitals  Encounter Vitals Group      BP      Girls Systolic BP Percentile      Girls Diastolic BP Percentile      Boys Systolic BP Percentile      Boys Diastolic BP Percentile      Pulse      Resp      Temp      Temp src      SpO2      Weight      Height      Head Circumference      Peak Flow      Pain Score      Pain Loc      Pain Education      Exclude from Growth Chart    No data found.  Updated Vital Signs BP 124/69 (BP Location: Right Arm)   Pulse 85   Temp 98.3 F (36.8 C) (Oral)   Resp 16   SpO2 96%   Visual Acuity Right Eye Distance:   Left Eye Distance:   Bilateral  Distance:    Right Eye Near:   Left Eye Near:    Bilateral Near:     Physical Exam Vitals and nursing note reviewed.  Constitutional:      General: She is not in acute distress.    Appearance: Normal appearance. She is normal weight. She is not ill-appearing.  HENT:     Head: Normocephalic and atraumatic.     Mouth/Throat:     Mouth: Mucous membranes are moist.     Pharynx: Oropharynx is clear.  Eyes:     Extraocular Movements: Extraocular movements intact.     Pupils: Pupils are equal, round, and reactive to light.  Cardiovascular:     Rate and Rhythm: Normal rate and regular rhythm.     Heart sounds: Normal heart sounds.  Pulmonary:     Effort: Pulmonary effort is normal.     Breath sounds: Normal breath sounds. No wheezing, rhonchi or rales.  Musculoskeletal:        General: Normal range of motion.  Skin:    General: Skin is warm and dry.     Comments: Right thumb (lateral border) to nail plate: Mild tissue swelling with erythema noted please see image below  Neurological:     General: No focal deficit present.     Mental Status: She is alert and oriented to person, place, and time. Mental status is at baseline.  Psychiatric:        Mood and Affect: Mood normal.        Behavior: Behavior normal.        Thought Content: Thought content normal.      UC Treatments / Results  Labs (all labs ordered are  listed, but only abnormal results are displayed) Labs Reviewed - No data to display  EKG   Radiology No results found.  Procedures Procedures (including critical care time)  Medications Ordered in UC Medications - No data to display  Initial Impression / Assessment and Plan / UC Course  I have reviewed the triage vital signs and the nursing notes.  Pertinent labs & imaging results that were available during my care of the patient were reviewed by me and considered in my medical decision making (see chart for details).     MDM: 1.  Paronychia of right thumb-Rx'd doxycycline 100 mg capsule: Take 1 capsule twice daily x 7 days. Advised patient take medication as directed with food to completion.  Encouraged increase daily water intake to 64 ounces per day while taking this medication.  Advised if symptoms worsen and/or unresolved please follow-up with your PCP or here for further evaluation.  Work note provided to patient per her request.  Patient discharged home, hemodynamically stable. Final Clinical Impressions(s) / UC Diagnoses   Final diagnoses:  Paronychia of right thumb     Discharge Instructions      Advised patient take medication as directed with food to completion.  Encouraged increase daily water intake to 64 ounces per day while taking this medication.  Advised if symptoms worsen and/or unresolved please follow-up with your PCP or here for further evaluation.     ED Prescriptions     Medication Sig Dispense Auth. Provider   doxycycline (VIBRAMYCIN) 100 MG capsule Take 1 capsule (100 mg total) by mouth 2 (two) times daily for 7 days. 14 capsule [place] Kinyon, FNP      PDMP not reviewed this encounter.   Teddy Sharper, FNP 01/04/24 [phone]
